# Patient Record
Sex: Female | Born: 1990 | Race: White | Hispanic: No | Marital: Single | Smoking: Never smoker
Health system: Southern US, Community
[De-identification: ages and names within clinical notes are randomized; demographics above are authoritative.]

## PROBLEM LIST (undated history)

## (undated) DIAGNOSIS — G43909 Migraine, unspecified, not intractable, without status migrainosus: Secondary | ICD-10-CM

## (undated) DIAGNOSIS — J45909 Unspecified asthma, uncomplicated: Secondary | ICD-10-CM

## (undated) DIAGNOSIS — T7840XA Allergy, unspecified, initial encounter: Secondary | ICD-10-CM

## (undated) HISTORY — DX: Allergy, unspecified, initial encounter: T78.40XA

## (undated) HISTORY — DX: Migraine, unspecified, not intractable, without status migrainosus: G43.909

## (undated) HISTORY — PX: LAPAROSCOPY: SHX197

## (undated) HISTORY — PX: INNER EAR SURGERY: SHX679

## (undated) HISTORY — PX: MOUTH SURGERY: SHX715

## (undated) HISTORY — PX: KNEE SURGERY: SHX244

## (undated) HISTORY — DX: Unspecified asthma, uncomplicated: J45.909

---
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
## 2008-05-14 ENCOUNTER — Encounter: Admission: RE | Admit: 2008-05-14 | Discharge: 2008-05-14 | Payer: Self-pay | Admitting: Otolaryngology

## 2009-05-08 ENCOUNTER — Encounter: Admission: RE | Admit: 2009-05-08 | Discharge: 2009-05-08 | Payer: Self-pay | Admitting: Orthopedic Surgery

## 2011-02-19 NOTE — Discharge Summary (Signed)
NAME:  Amanda Bates, Amanda Bates NO.:  [id number]   MEDICAL RECORD NO.:  [id number]          PATIENT TYPE:  INP   LOCATION:  6125                         FACILITY:  [facility]   PHYSICIAN:  Dyann Ruddle, MDDATE OF BIRTH:  Oct 29, 1990   DATE OF ADMISSION:  10/05/2006  DATE OF DISCHARGE:                               DISCHARGE SUMMARY   REASON FOR HOSPITALIZATION:  Severe pharyngitis.   SIGNIFICANT FINDINGS:  The patient was admitted for severe pharyngitis  which started on October 03, 2005, making it difficult for her to eat  despite treatment with oxycodone.  At that time, she was negative for  strep by rapid test, flu, and also had a negative Monospot at that time.  On this admission, a CT of the neck was done.  It showed tonsillar  hypertrophy but no abscess or airway impingement.  CBC showed a white  count of 21.2, hemoglobin 12.4, and platelets 187.  Her Monospot was  again negative.  Her blood culture was drawn and is pending.   TREATMENT:  For pain control, the patient received IV morphine as needed  and also Toradol every 6 hours.  She was started on Solu-Medrol every 12  hours as well as Unasyn.  She was able to take improved p.o. within 12  hours of her admission.  She was on maintenance IV fluids while in the  hospital.   OPERATIONS AND PROCEDURES:  None.   FINAL DIAGNOSES:  Pharyngitis, unknown etiology.   DISCHARGE MEDICATIONS:  1. Augmentin ES (600 mg/5 mL).  Take 7 mL p.o. b.i.d. for 9 more days.  2. Hydrocodone liquid as previously prescribed:  Take 2 to 3 teaspoons      every 4 to 6 hours as needed for pain.  3. Motrin 400 mg p.o. every 4 hours p.r.n. pain or fever.  4. Dexamethasone liquid 13 mg p.o. daily for 4 more days.   PENDING RESULTS:  EBV serology.   Follow up with Dr. Hosie Poisson either tomorrow, October 07, 2006, if no  better, or if improving on October 10, 2006.   DISCHARGE WEIGHT:  45.3.   DISCHARGE CONDITION:  Fair.     ______________________________  Pediatrics Resident    ______________________________  Dyann Ruddle, MD   PR/MEDQ  D:  10/06/2006  T:  10/06/2006  Job:  [phone]

## 2012-02-19 ENCOUNTER — Encounter (HOSPITAL_COMMUNITY): Payer: Self-pay | Admitting: Family Medicine

## 2012-02-19 ENCOUNTER — Emergency Department (HOSPITAL_COMMUNITY)
Admission: EM | Admit: 2012-02-19 | Discharge: 2012-02-19 | Disposition: A | Discharge status: Home / Self Care | Payer: BC Managed Care – PPO | Attending: Emergency Medicine | Admitting: Emergency Medicine

## 2012-02-19 DIAGNOSIS — R000 Tachycardia, unspecified: Secondary | ICD-10-CM | POA: Insufficient documentation

## 2012-02-19 DIAGNOSIS — R780 Finding of alcohol in blood: Secondary | ICD-10-CM | POA: Insufficient documentation

## 2012-02-19 DIAGNOSIS — F1010 Alcohol abuse, uncomplicated: Secondary | ICD-10-CM | POA: Insufficient documentation

## 2012-02-19 LAB — DIFFERENTIAL
Eosinophils Absolute: 0.1 10*3/uL (ref 0.0–0.7)
Eosinophils Relative: 1 % (ref 0–5)
Lymphs Abs: 2.3 10*3/uL (ref 0.7–4.0)
Monocytes Absolute: 0.3 10*3/uL (ref 0.1–1.0)

## 2012-02-19 LAB — COMPREHENSIVE METABOLIC PANEL
ALT: 15 U/L (ref 0–35)
CO2: 21 mEq/L (ref 19–32)
Calcium: 8.8 mg/dL (ref 8.4–10.5)
Creatinine, Ser: 0.53 mg/dL (ref 0.50–1.10)
GFR calc Af Amer: >90 mL/min (ref >90)
GFR calc non Af Amer: >90 mL/min (ref >90)
Glucose, Bld: 100 mg/dL — ABNORMAL HIGH (ref 70–99)
Sodium: 142 mEq/L (ref 135–145)
Total Protein: 7.6 g/dL (ref 6.0–8.3)

## 2012-02-19 LAB — CBC
HCT: 39 % (ref 36.0–46.0)
MCH: 31.4 pg (ref 26.0–34.0)
MCV: 92.2 fL (ref 78.0–100.0)
Platelets: 269 10*3/uL (ref 150–400)
RBC: 4.23 MIL/uL (ref 3.87–5.11)
RDW: 11.9 % (ref 11.5–15.5)

## 2012-02-19 MED ORDER — ONDANSETRON HCL 4 MG/2ML IJ SOLN
4.0000 mg | Freq: Once | INTRAMUSCULAR | Status: AC
  Administered 2012-02-19: 4 mg via INTRAVENOUS

## 2012-02-19 MED ORDER — SODIUM CHLORIDE 0.9 % IV BOLUS (SEPSIS)
1000.0000 mL | Freq: Once | INTRAVENOUS | Status: AC
  Administered 2012-02-19: 1000 mL via INTRAVENOUS

## 2012-02-19 MED ORDER — ONDANSETRON HCL 4 MG/2ML IJ SOLN
INTRAMUSCULAR | Status: AC
  Filled 2012-02-19: qty 2

## 2012-02-19 NOTE — Discharge Instructions (Signed)
Alcohol Intoxication Alcohol intoxication means your blood alcohol level is above legal limits. Alcohol is a drug. It has serious side effects. These side effects can include:  Damage to your organs (liver, nervous system, and blood system).   Unclear thinking.   Slowed reflexes.   Decreased muscle coordination.  HOME CARE  Do not drink and drive.   Do not drink alcohol if you are taking medicine or using other drugs. Doing so can cause serious medical problems or even death.   Drink enough water and fluids to keep your pee (urine) clear or pale yellow.   Eat healthy foods.   Only take medicine as told by your doctor.   Join an alcohol support group.  GET HELP RIGHT AWAY IF:  You become shaky when you stop drinking.   Your thinking is unclear or you become confused.   You throw up (vomit) blood. It may look bright red or like coffee grounds.   You notice blood in your poop (bowel movements).   You become lightheaded or pass out (faint).  MAKE SURE YOU:   Understand these instructions.   Will watch your condition.   Will get help right away if you are not doing well or get worse.  Document Released: 03/08/2008 Document Revised: 09/09/2011 Document Reviewed: 03/09/2010 [employer] Patient Information 2012 [employer]. 

## 2012-02-19 NOTE — ED Notes (Signed)
Driver's Lisence and credit cards given to pt's mother.

## 2012-02-19 NOTE — ED Notes (Signed)
Patient states she remembers drinking a bottle of wine and taking "a couple of shots."

## 2012-02-19 NOTE — ED Provider Notes (Signed)
History     CSN: [id number]  Arrival date & time 02/19/12  0306   First MD Initiated Contact with Patient 02/19/12 [phone]      Chief Complaint  Patient presents with  . Alcohol Intoxication    (Consider location/radiation/quality/duration/timing/severity/associated sxs/prior treatment) HPI Comments: Found by her friends slumped over a stir rail after drinking a large amount of ETOH  he did normally does not drink as much.  She was out with friends, now celebrating, but disorder grieving the end of her relationship, parents at the bedside.  He denies any injury, stating she feels better at this time  The history is provided by the patient.    History reviewed. No pertinent past medical history.  Past Surgical History  Procedure Date  . Mouth surgery   . Inner ear surgery   . Knee surgery     No family history on file.  History  Substance Use Topics  . Smoking status: Never Smoker   . Smokeless tobacco: Not on file  . Alcohol Use: Yes     Occasional    OB History    Grav Para Term Preterm Abortions TAB SAB Ect Mult Living                  Review of Systems  Constitutional: Positive for activity change.  Gastrointestinal: Positive for nausea and vomiting.  Psychiatric/Behavioral: Negative for self-injury.    Allergies  Review of patient's allergies indicates no known allergies.  Home Medications   Current Outpatient Rx  Name Route Sig Dispense Refill  . AZITHROMYCIN 500 MG PO TABS Oral Take 1,000 mg by mouth once.    Marland Kitchen LOESTRIN 24 FE PO Oral Take 1 tablet by mouth daily.      BP 111/69  Pulse 120  Temp(Src) 98.5 F (36.9 C) (Oral)  Resp 20  SpO2 98%  LMP 11/24/2011  Physical Exam  Constitutional: She is oriented to person, place, and time. She appears well-developed and well-nourished.  HENT:  Head: Normocephalic.  Eyes: Pupils are equal, round, and reactive to light.  Neck: Normal range of motion.  Cardiovascular: Tachycardia present.     Pulmonary/Chest: Effort normal.  Abdominal: Soft. She exhibits no distension.  Musculoskeletal: Normal range of motion.  Neurological: She is alert and oriented to person, place, and time.  Skin: Skin is warm and dry.  Psychiatric: She expresses impulsivity.    ED Course  Procedures (including critical care time)  Labs Reviewed  ETHANOL - Abnormal; Notable for the following:    Alcohol, Ethyl (B) 171 (*)    All other components within normal limits  COMPREHENSIVE METABOLIC PANEL - Abnormal; Notable for the following:    Potassium 3.4 (*)    Glucose, Bld 100 (*)    Total Bilirubin 0.2 (*)    All other components within normal limits  CBC  DIFFERENTIAL   No results found.   No diagnosis found.    MDM          Arman Filter, NP 02/19/12 [phone]

## 2012-02-19 NOTE — ED Notes (Signed)
Per EMS, patient was found slumped over steps by a bar. Has had unknown amount of wine, beer and liquor.

## 2012-02-21 NOTE — ED Provider Notes (Signed)
Medical screening examination/treatment/procedure(s) were performed by non-physician practitioner and as supervising physician I was immediately available for consultation/collaboration.  Eshal Propps, MD 02/21/12 0405 

## 2012-04-17 ENCOUNTER — Ambulatory Visit: Payer: BC Managed Care – PPO | Admitting: Family Medicine

## 2012-04-17 VITALS — BP 125/76 | HR 76 | Temp 98.7°F | Resp 16 | Ht 63.0 in | Wt 98.0 lb

## 2012-04-17 DIAGNOSIS — L299 Pruritus, unspecified: Secondary | ICD-10-CM

## 2012-04-17 DIAGNOSIS — R21 Rash and other nonspecific skin eruption: Secondary | ICD-10-CM

## 2012-04-17 MED ORDER — TRIAMCINOLONE ACETONIDE 0.1 % EX CREA: TOPICAL_CREAM | Freq: Two times a day (BID) | CUTANEOUS | Status: AC

## 2012-04-17 MED ORDER — DOXYCYCLINE HYCLATE 100 MG PO TABS: 100.0000 mg | ORAL_TABLET | Freq: Two times a day (BID) | ORAL | Status: AC

## 2012-04-17 NOTE — Progress Notes (Signed)
[facility]:  Office Visit  Chief Complaint:  Chief Complaint  Patient presents with  . Insect Bite    on legs    HPI: Amanda Bates is a 21 y.o. female who complains of  1 week h/o bilateral leg rash. Running. [place]. Mom has it all over her chest. Starts out as blister like then turns into purple rash. Described as burning. URI sxs, more muscle aches. But assumed sxs were from running. No new meds, foods, travels except to beach.  History reviewed. No pertinent past medical history. Past Surgical History  Procedure Date  . Mouth surgery   . Inner ear surgery   . Knee surgery    History   Social History  . Marital Status: Single    Spouse Name: N/A    Number of Children: N/A  . Years of Education: N/A   Social History Main Topics  . Smoking status: Never Smoker   . Smokeless tobacco: None  . Alcohol Use: Yes     Occasional  . Drug Use: Yes    Special: Marijuana  . Sexually Active: Yes   Other Topics Concern  . None   Social History Narrative  . None   No family history on file. No Known Allergies Prior to Admission medications   Medication Sig Start Date End Date Taking? Authorizing Provider  Norethin Ace-Eth Estrad-FE (LOESTRIN 24 FE PO) Take 1 tablet by mouth daily.   Yes Historical Provider, MD  azithromycin (ZITHROMAX) 500 MG tablet Take 1,000 mg by mouth once.    Historical Provider, MD  doxycycline (VIBRA-TABS) 100 MG tablet Take 1 tablet (100 mg total) by mouth 2 (two) times daily. 04/17/12 04/27/12  Irais Mottram P Beulah Matusek, DO  triamcinolone cream (KENALOG) 0.1 % Apply topically 2 (two) times daily. 04/17/12 04/17/13  Ashon Rosenberg P Marialy Urbanczyk, DO     ROS: The patient denies fevers, chills, night sweats, unintentional weight loss, chest pain, palpitations, wheezing, dyspnea on exertion, nausea, vomiting, abdominal pain, dysuria, hematuria, melena, numbness, weakness, or tingling.   All other systems have been reviewed and were otherwise negative with the exception of  those mentioned in the HPI and as above.    PHYSICAL EXAM: Filed Vitals:   04/17/12 1737  BP: 125/76  Pulse: 76  Temp: 98.7 F (37.1 C)  Resp: 16   Filed Vitals:   04/17/12 1737  Height: 5\' 3"  (1.6 m)  Weight: 98 lb (44.453 kg)   Body mass index is 17.36 kg/(m^2).  General: Alert, no acute distress HEENT:  Normocephalic, atraumatic, oropharynx patent.  Cardiovascular:  Regular rate and rhythm, no rubs murmurs or gallops.  No Carotid bruits, radial pulse intact. No pedal edema.  Respiratory: Clear to auscultation bilaterally.  No wheezes, rales, or rhonchi.  No cyanosis, no use of accessory musculature GI: No organomegaly, abdomen is soft and non-tender, positive bowel sounds.  No masses. Skin: + rash, nonblanching purple circular lesions macular about 1/4 inch in diameter and length on legs x 2 on right and 1 on left leg, no petechiae Neurologic: Facial musculature symmetric. Psychiatric: Patient is appropriate throughout our interaction. Lymphatic: No cervical lymphadenopathy Musculoskeletal: Gait intact.   LABS: Results for orders placed during the hospital encounter of 02/19/12  ETHANOL      Component Value Range   Alcohol, Ethyl (B) 171 (*) 0 - 11 mg/dL  CBC      Component Value Range   WBC 5.2  4.0 - 10.5 K/uL   RBC 4.23  3.87 - 5.11 MIL/uL   Hemoglobin 13.3  12.0 - 15.0 g/dL   HCT 16.1  09.6 - 04.5 %   MCV 92.2  78.0 - 100.0 fL   MCH 31.4  26.0 - 34.0 pg   MCHC 34.1  30.0 - 36.0 g/dL   RDW 40.9  81.1 - 91.4 %   Platelets 269  150 - 400 K/uL  DIFFERENTIAL      Component Value Range   Neutrophils Relative 48  43 - 77 %   Neutro Abs 2.5  1.7 - 7.7 K/uL   Lymphocytes Relative 45  12 - 46 %   Lymphs Abs 2.3  0.7 - 4.0 K/uL   Monocytes Relative 6  3 - 12 %   Monocytes Absolute 0.3  0.1 - 1.0 K/uL   Eosinophils Relative 1  0 - 5 %   Eosinophils Absolute 0.1  0.0 - 0.7 K/uL   Basophils Relative 1  0 - 1 %   Basophils Absolute 0.0  0.0 - 0.1 K/uL  COMPREHENSIVE  METABOLIC PANEL      Component Value Range   Sodium 142  135 - 145 mEq/L   Potassium 3.4 (*) 3.5 - 5.1 mEq/L   Chloride 106  96 - 112 mEq/L   CO2 21  19 - 32 mEq/L   Glucose, Bld 100 (*) 70 - 99 mg/dL   BUN 7  6 - 23 mg/dL   Creatinine, Ser 7.82  0.50 - 1.10 mg/dL   Calcium 8.8  8.4 - 95.6 mg/dL   Total Protein 7.6  6.0 - 8.3 g/dL   Albumin 4.1  3.5 - 5.2 g/dL   AST 22  0 - 37 U/L   ALT 15  0 - 35 U/L   Alkaline Phosphatase 51  39 - 117 U/L   Total Bilirubin 0.2 (*) 0.3 - 1.2 mg/dL   GFR calc non Af Amer >90  >90 mL/min   GFR calc Af Amer >90  >90 mL/min     EKG/XRAY:   Primary read interpreted by Dr. Conley Rolls at [facility].   ASSESSMENT/PLAN: Encounter Diagnoses  Name Primary?  . Rash Yes  . Itching     Will presumptively treat for tick borne disease due to tick exposure from running on grassy trail and new onset rash.  Rx Doxycycline and also triamcinolone cream prn itch/burn.    Hamilton Capri PHUONG, DO 04/17/2012 6:44 PM

## 2012-05-02 ENCOUNTER — Ambulatory Visit
Admission: RE | Admit: 2012-05-02 | Discharge: 2012-05-02 | Disposition: A | Discharge status: Home / Self Care | Payer: BC Managed Care – PPO | Source: Ambulatory Visit | Attending: Family Medicine | Admitting: Family Medicine

## 2012-05-02 ENCOUNTER — Other Ambulatory Visit: Payer: Self-pay | Admitting: Family Medicine

## 2012-05-02 DIAGNOSIS — M545 Low back pain, unspecified: Secondary | ICD-10-CM

## 2012-09-24 ENCOUNTER — Ambulatory Visit: Payer: BC Managed Care – PPO | Admitting: Family Medicine

## 2012-09-24 ENCOUNTER — Ambulatory Visit: Payer: BC Managed Care – PPO

## 2012-09-24 VITALS — BP 112/69 | HR 87 | Temp 98.3°F | Resp 18 | Ht 62.0 in | Wt 100.0 lb

## 2012-09-24 DIAGNOSIS — M79604 Pain in right leg: Secondary | ICD-10-CM

## 2012-09-24 DIAGNOSIS — M79609 Pain in unspecified limb: Secondary | ICD-10-CM

## 2012-09-24 MED ORDER — MELOXICAM 7.5 MG PO TABS: 7.5000 mg | ORAL_TABLET | Freq: Every day | ORAL | Status: DC

## 2012-09-24 NOTE — Progress Notes (Signed)
21 yo runner who [place] in [place]. Who was diagnosed with right tibial stress fracture 4 weeks ago.  SHe had a cam walker for 3 weeks and then a week ago was told to stop the cam walker and not resume for another 3 weeks. Ibuprofen is not helping.  Pain is aching in quality  Objective:  NAD Mild ecchymotic discoloration with some swelling in proximal third of the tibia.  Nontender tibia.  No bony abnormality.  [facility] reading (PRIMARY) by  Dr. Milus Glazier: tib/fib.  No abnormality   Assessment: This patient is recovering from a stress fracture. The new location of pain along with swelling suggestion she may have shin splints.  Plan:  Ice the tibia, start Mobic 7.5 mg daily  Recheck 1 week

## 2012-09-24 NOTE — Patient Instructions (Addendum)
Plan:  Ice the tibia three times a day for 20 minutes, start Mobic 7.5 mg daily  Recheck 1 week     Shin Splints Shin splints is a painful condition that is felt on the shinbone or in the muscles on either side of the bone (front of your lower leg). Shin splints happen when physical activities, such as sports or other demanding exercise, leads to inflammation of the muscles, tendons, and the thin layer that covers the shinbone.  CAUSES   Overuse of muscles.  Repetitive activities.  Flat feet or rigid arches. Activities that could contribute to shin splints include:  A sudden increase in exercise time.  Starting a new, demanding activity.  Running up hills or long distances.  Playing sports with sudden starts and stops.  A poor warm up.  Old or worn-out shoes. SYMPTOMS   Pain on the front of the leg.  Pain while exercising or at rest. DIAGNOSIS  Your caregiver will diagnose shin splints from a history of your symptoms and a physical exam. You may be observed as you walk or run. X-ray exams or further testing may be needed to rule out other problems, such as a stress fracture, which also causes lower leg pain. TREATMENT  Your caregiver may decide on the treatment based on your age, history, health, and how bad the pain is. Most cases of shin splints can be managed by one or more of the following:  Resting.  Reducing the length and intensity of your exercise.  Stopping the activity that causes shin pain.  Taking medicines to control the inflammation.  Icing, massaging, stretching, and strengthening the affected area.  Getting shoes with rigid heels, shock absorption, and a good arch support. HOME CARE INSTRUCTIONS   Resume activity steadily or as directed by your caregiver.  Restart your exercise sessions with non-weight-bearing exercises, such as cycling or swimming.  Stop running if the pain returns.  Warm up properly before exercising.  Run on a level and  fairly firm surface.  Gradually change the intensity of an exercise.  Limit increases in running distance by no more than 5 to 10% weekly. This means if you are running 5 miles, you can only increase your run by 1/2 a mile at a time.  Change your athletic shoes every 6 months, or every 350 to 450 miles. SEEK MEDICAL CARE IF:   Symptoms continue or worsen even after treatment.  The location, intensity, or type of pain changes over time. SEEK IMMEDIATE MEDICAL CARE IF:   You have severe pain.  You have trouble walking. MAKE SURE YOU:  Understand these instructions.  Will watch your condition.  Will get help right away if you are not doing well or get worse. Document Released: 09/17/2000 Document Revised: 12/13/2011 Document Reviewed: 03/07/2011 [facility] Patient Information 2013 [place], [place].

## 2013-09-02 ENCOUNTER — Ambulatory Visit: Payer: BC Managed Care – PPO | Admitting: Internal Medicine

## 2013-09-02 VITALS — BP 113/77 | HR 99 | Temp 98.1°F | Resp 16 | Ht 63.0 in | Wt 95.2 lb

## 2013-09-02 DIAGNOSIS — K529 Noninfective gastroenteritis and colitis, unspecified: Secondary | ICD-10-CM

## 2013-09-02 DIAGNOSIS — R1013 Epigastric pain: Secondary | ICD-10-CM

## 2013-09-02 DIAGNOSIS — K5289 Other specified noninfective gastroenteritis and colitis: Secondary | ICD-10-CM

## 2013-09-02 DIAGNOSIS — E860 Dehydration: Secondary | ICD-10-CM

## 2013-09-02 DIAGNOSIS — N390 Urinary tract infection, site not specified: Secondary | ICD-10-CM

## 2013-09-02 DIAGNOSIS — R42 Dizziness and giddiness: Secondary | ICD-10-CM

## 2013-09-02 DIAGNOSIS — R8271 Bacteriuria: Secondary | ICD-10-CM

## 2013-09-02 LAB — POCT CBC
Granulocyte percent: 51.9 %G (ref 37–80)
HCT, POC: 39.7 % (ref 37.7–47.9)
Hemoglobin: 12.2 g/dL (ref 12.2–16.2)
Lymph, poc: 1.3 (ref 0.6–3.4)
MCH, POC: 30.9 pg (ref 27–31.2)
MCHC: 30.7 g/dL — AB (ref 31.8–35.4)
MCV: 100.4 fL — AB (ref 80–97)
MID (cbc): 0.3 (ref 0–0.9)
MPV: 9.6 fL (ref 0–99.8)
POC Granulocyte: 1.7 — AB (ref 2–6.9)
POC LYMPH PERCENT: 40.2 %L (ref 10–50)
POC MID %: 7.9 %M (ref 0–12)
Platelet Count, POC: 147 10*3/uL (ref 142–424)
RBC: 3.95 M/uL — AB (ref 4.04–5.48)
RDW, POC: 13 %
WBC: 3.2 10*3/uL — AB (ref 4.6–10.2)

## 2013-09-02 LAB — COMPREHENSIVE METABOLIC PANEL
ALT: 18 U/L (ref 0–35)
AST: 19 U/L (ref 0–37)
Albumin: 4.2 g/dL (ref 3.5–5.2)
Alkaline Phosphatase: 44 U/L (ref 39–117)
BUN: 12 mg/dL (ref 6–23)
CO2: 27 mEq/L (ref 19–32)
Calcium: 9.1 mg/dL (ref 8.4–10.5)
Chloride: 104 mEq/L (ref 96–112)
Creat: 0.72 mg/dL (ref 0.50–1.10)
Glucose, Bld: 85 mg/dL (ref 70–99)
Potassium: 4.3 mEq/L (ref 3.5–5.3)
Sodium: 140 mEq/L (ref 135–145)
Total Bilirubin: 0.5 mg/dL (ref 0.3–1.2)
Total Protein: 6.7 g/dL (ref 6.0–8.3)

## 2013-09-02 LAB — POCT URINALYSIS DIPSTICK
Blood, UA: NEGATIVE
Glucose, UA: NEGATIVE
Ketones, UA: 15
Nitrite, UA: NEGATIVE
Spec Grav, UA: 1.025
Urobilinogen, UA: 1
pH, UA: 5.5

## 2013-09-02 LAB — IFOBT (OCCULT BLOOD): IFOBT: NEGATIVE

## 2013-09-02 LAB — POCT UA - MICROSCOPIC ONLY
Casts, Ur, LPF, POC: NEGATIVE
Crystals, Ur, HPF, POC: NEGATIVE
Yeast, UA: NEGATIVE

## 2013-09-02 LAB — LIPASE: Lipase: <10 U/L (ref 0–75)

## 2013-09-02 MED ORDER — ONDANSETRON 4 MG PO TBDP
8.0000 mg | ORAL_TABLET | Freq: Once | ORAL | Status: AC
  Administered 2013-09-02: 8 mg via ORAL

## 2013-09-02 MED ORDER — AMOXICILLIN 500 MG PO CAPS: 1000.0000 mg | ORAL_CAPSULE | Freq: Two times a day (BID) | ORAL | Status: DC

## 2013-09-02 MED ORDER — ONDANSETRON HCL 8 MG PO TABS: 8.0000 mg | ORAL_TABLET | Freq: Three times a day (TID) | ORAL | Status: DC | PRN

## 2013-09-02 NOTE — Patient Instructions (Signed)
Diet for Diarrhea, Adult Frequent, runny stools (diarrhea) may be caused or worsened by food or drink. Diarrhea may be relieved by changing your diet. Since diarrhea can last up to 7 days, it is easy for you to lose too much fluid from the body and become dehydrated. Fluids that are lost need to be replaced. Along with a modified diet, make sure you drink enough fluids to keep your urine clear or pale yellow. DIET INSTRUCTIONS  Ensure adequate fluid intake (hydration): have 1 cup (8 oz) of fluid for each diarrhea episode. Avoid fluids that contain simple sugars or sports drinks, fruit juices, whole milk products, and sodas. Your urine should be clear or pale yellow if you are drinking enough fluids. Hydrate with an oral rehydration solution that you can purchase at pharmacies, retail stores, and online. You can prepare an oral rehydration solution at home by mixing the following ingredients together:    tsp table salt.   tsp baking soda.   tsp salt substitute containing potassium chloride.  1  tablespoons sugar.  1 L (34 oz) of water.  Certain foods and beverages may increase the speed at which food moves through the gastrointestinal (GI) tract. These foods and beverages should be avoided and include:  Caffeinated and alcoholic beverages.  High-fiber foods, such as raw fruits and vegetables, nuts, seeds, and whole grain breads and cereals.  Foods and beverages sweetened with sugar alcohols, such as xylitol, sorbitol, and mannitol.  Some foods may be well tolerated and may help thicken stool including:  Starchy foods, such as rice, toast, pasta, low-sugar cereal, oatmeal, grits, baked potatoes, crackers, and bagels.   Bananas.   Applesauce.  Add probiotic-rich foods to help increase healthy bacteria in the GI tract, such as yogurt and fermented milk products. RECOMMENDED FOODS AND BEVERAGES Starches Choose foods with less than 2 g of fiber per serving.  Recommended:  White,  [place], and pita breads, plain rolls, buns, bagels. Plain muffins, matzo. Soda, saltine, or graham crackers. Pretzels, melba toast, zwieback. Cooked cereals made with water: cornmeal, farina, cream cereals. Dry cereals: refined corn, wheat, rice. Potatoes prepared any way without skins, refined macaroni, spaghetti, noodles, refined rice.  Avoid:  Bread, rolls, or crackers made with whole wheat, multi-grains, rye, bran seeds, nuts, or coconut. Corn tortillas or taco shells. Cereals containing whole grains, multi-grains, bran, coconut, nuts, raisins. Cooked or dry oatmeal. Coarse wheat cereals, granola. Cereals advertised as "high-fiber." Potato skins. Whole grain pasta, wild or brown rice. Popcorn. Sweet potatoes, yams. Sweet rolls, doughnuts, waffles, pancakes, sweet breads. Vegetables  Recommended: Strained tomato and vegetable juices. Most well-cooked and canned vegetables without seeds. Fresh: Tender lettuce, cucumber without the skin, cabbage, spinach, bean sprouts.  Avoid: Fresh, cooked, or canned: Artichokes, baked beans, beet greens, broccoli, Brussels sprouts, corn, kale, legumes, peas, sweet potatoes. Cooked: Green or red cabbage, spinach. Avoid large servings of any vegetables because vegetables shrink when cooked, and they contain more fiber per serving than fresh vegetables. Fruit  Recommended: Cooked or canned: Apricots, applesauce, cantaloupe, cherries, fruit cocktail, grapefruit, grapes, kiwi, mandarin oranges, peaches, pears, plums, watermelon. Fresh: Apples without skin, ripe banana, grapes, cantaloupe, cherries, grapefruit, peaches, oranges, plums. Keep servings limited to  cup or 1 piece.  Avoid: Fresh: Apples with skin, apricots, mangoes, pears, raspberries, strawberries. Prune juice, stewed or dried prunes. Dried fruits, raisins, dates. Large servings of all fresh fruits. Protein  Recommended: Ground or well-cooked tender beef, ham, veal, lamb, pork, or poultry. Eggs. Fish,  oysters, shrimp,  lobster, other seafoods. Liver, organ meats.  Avoid: Tough, fibrous meats with gristle. Peanut butter, smooth or chunky. Cheese, nuts, seeds, legumes, dried peas, beans, lentils. Dairy  Recommended: Yogurt, lactose-free milk, kefir, drinkable yogurt, buttermilk, soy milk, or plain hard cheese.  Avoid: Milk, chocolate milk, beverages made with milk, such as milkshakes. Soups  Recommended: Bouillon, broth, or soups made from allowed foods. Any strained soup.  Avoid: Soups made from vegetables that are not allowed, cream or milk-based soups. Desserts and Sweets  Recommended: Sugar-free gelatin, sugar-free frozen ice pops made without sugar alcohol.  Avoid: Plain cakes and cookies, pie made with fruit, pudding, custard, cream pie. Gelatin, fruit, ice, sherbet, frozen ice pops. Ice cream, ice milk without nuts. Plain hard candy, honey, jelly, molasses, syrup, sugar, chocolate syrup, gumdrops, marshmallows. Fats and Oils  Recommended: Limit fats to less than 8 tsp per day.  Avoid: Seeds, nuts, olives, avocados. Margarine, butter, cream, mayonnaise, salad oils, plain salad dressings. Plain gravy, crisp bacon without rind. Beverages  Recommended: Water, decaffeinated teas, oral rehydration solutions, sugar-free beverages not sweetened with sugar alcohols.  Avoid: Fruit juices, caffeinated beverages (coffee, tea, soda), alcohol, sports drinks, or lemon-lime soda. Condiments  Recommended: Ketchup, mustard, horseradish, vinegar, cocoa powder. Spices in moderation: allspice, basil, bay leaves, celery powder or leaves, cinnamon, cumin powder, curry powder, ginger, mace, marjoram, onion or garlic powder, oregano, paprika, parsley flakes, ground pepper, rosemary, sage, savory, tarragon, thyme, turmeric.  Avoid: Coconut, honey. Document Released: 12/11/2003 Document Revised: 06/14/2012 Document Reviewed: 02/04/2012 [facility] Patient Information 2014 [place], [place]. Norovirus  Infection Norovirus illness is caused by a viral infection. The term norovirus refers to a group of viruses. Any of those viruses can cause norovirus illness. This illness is often referred to by other names such as viral gastroenteritis, stomach flu, and food poisoning. Anyone can get a norovirus infection. People can have the illness multiple times during their lifetime. CAUSES  Norovirus is found in the stool or vomit of infected people. It is easily spread from person to person (contagious). People with norovirus are contagious from the moment they begin feeling ill. They may remain contagious for as long as 3 days to 2 weeks after recovery. People can become infected with the virus in several ways. This includes:  Eating food or drinking liquids that are contaminated with norovirus.  Touching surfaces or objects contaminated with norovirus, and then placing your hand in your mouth.  Having direct contact with a person who is infected and shows symptoms. This may occur while caring for someone with illness or while sharing foods or eating utensils with someone who is ill. SYMPTOMS  Symptoms usually begin 1 to 2 days after ingestion of the virus. Symptoms may include:  Nausea.  Vomiting.  Diarrhea.  Stomach cramps.  Low-grade fever.  Chills.  Headache.  Muscle aches.  Tiredness. Most people with norovirus illness get better within 1 to 2 days. Some people become dehydrated because they cannot drink enough liquids to replace those lost from vomiting and diarrhea. This is especially true for young children, the elderly, and others who are unable to care for themselves. DIAGNOSIS  Diagnosis is based on your symptoms and exam. Currently, only state public health laboratories have the ability to test for norovirus in stool or vomit. TREATMENT  No specific treatment exists for norovirus infections. No vaccine is available to prevent infections. Norovirus illness is usually brief in  healthy people. If you are ill with vomiting and diarrhea, you should drink enough water  and fluids to keep your urine clear or pale yellow. Dehydration is the most serious health effect that can result from this infection. By drinking oral rehydration solution (ORS), people can reduce their chance of becoming dehydrated. There are many commercially available pre-made and powdered ORS designed to safely rehydrate people. These may be recommended by your caregiver. Replace any new fluid losses from diarrhea or vomiting with ORS as follows:  If your child weighs 10 kg or less (22 lb or less), give 60 to 120 ml ( to  cup or 2 to 4 oz) of ORS for each diarrheal stool or vomiting episode.  If your child weighs more than 10 kg (more than 22 lb), give 120 to 240 ml ( to 1 cup or 4 to 8 oz) of ORS for each diarrheal stool or vomiting episode. HOME CARE INSTRUCTIONS   Follow all your caregiver's instructions.  Avoid sugar-free and alcoholic drinks while ill.  Only take over-the-counter or prescription medicines for pain, vomiting, diarrhea, or fever as directed by your caregiver. You can decrease your chances of coming in contact with norovirus or spreading it by following these steps:  Frequently wash your hands, especially after using the toilet, changing diapers, and before eating or preparing food.  Carefully wash fruits and vegetables. Cook shellfish before eating them.  Do not prepare food for others while you are infected and for at least 3 days after recovering from illness.  Thoroughly clean and disinfect contaminated surfaces immediately after an episode of illness using a bleach-based household cleaner.  Immediately remove and wash clothing or linens that may be contaminated with the virus.  Use the toilet to dispose of any vomit or stool. Make sure the surrounding area is kept clean.  Food that may have been contaminated by an ill person should be discarded. SEEK IMMEDIATE MEDICAL  CARE IF:   You develop symptoms of dehydration that do not improve with fluid replacement. This may include:  Excessive sleepiness.  Lack of tears.  Dry mouth.  Dizziness when standing.  Weak pulse. Document Released: 12/11/2002 Document Revised: 12/13/2011 Document Reviewed: 01/12/2010 [facility] Patient Information 2014 [place], [place].

## 2013-09-02 NOTE — Progress Notes (Signed)
   Subjective:    Patient ID: Amanda Bates, female    DOB: [birth year], 22 y.o.   MRN: [id number]  HPI    Review of Systems     Objective:   Physical Exam        Assessment & Plan:

## 2013-09-02 NOTE — Progress Notes (Signed)
Subjective:    Patient ID: Amanda Bates, female    DOB: 1991/02/08, 22 y.o.   MRN: [id number]  HPI Sxs started 3 nights with abd pain, vomiting and diarrhea. Vomiting lasted about 12 hrs and she hasn't vomited since. Diarrhea continued until last night. Tried Kaopectate last night with success, helped with diarrhea. Had 100-101 fever Fri and Sat. Now having abd cramping and black stools starting this morning. Has been unable to eat anything but soup. Has been keeping pedialyte down for the last 2 days with a little nausea, but no vomiting. Has lost 7 lbs since Thurs night. No h/o of abd surgeries, ulcers, or abd bleeding. Dizzy when rises, weak, nausea with fluid intake. Actually better than she was.  Healthy otherwise. 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    Review of Systems [employer]    Objective:   Physical Exam  Vitals reviewed. Constitutional: She is oriented to person, place, and time. She appears well-developed and well-nourished. No distress.  HENT:  Mouth/Throat: Oropharynx is clear and moist.  Cardiovascular: Normal rate, regular rhythm and normal heart sounds.   Pulmonary/Chest: Effort normal.  Abdominal: Soft. She exhibits no distension and no mass. Bowel sounds are decreased. There is no hepatosplenomegaly. There is tenderness in the epigastric area. There is no rigidity, no rebound, no guarding, no CVA tenderness, no tenderness at McBurney's point and negative Murphy's sign.  Musculoskeletal: Normal range of motion.  Neurological: She is alert and oriented to person, place, and time. No cranial nerve deficit. She exhibits normal muscle tone. Coordination normal.  Skin: No rash noted.  Psychiatric: She has a normal mood and affect. Her behavior is normal.   MMs moist  Labs.this  Results for orders placed in visit on 09/02/13  POCT CBC      Result Value Range   WBC 3.2 (*) 4.6 - 10.2 K/uL   Lymph, poc 1.3  0.6 - 3.4   POC LYMPH PERCENT  40.2  10 - 50 %L   MID (cbc) 0.3  0 - 0.9   POC MID % 7.9  0 - 12 %M   POC Granulocyte 1.7 (*) 2 - 6.9   Granulocyte percent 51.9  37 - 80 %G   RBC 3.95 (*) 4.04 - 5.48 M/uL   Hemoglobin 12.2  12.2 - 16.2 g/dL   HCT, POC 40.9  81.1 - 47.9 %   MCV 100.4 (*) 80 - 97 fL   MCH, POC 30.9  27 - 31.2 pg   MCHC 30.7 (*) 31.8 - 35.4 g/dL   RDW, POC 91.4     Platelet Count, POC 147  142 - 424 K/uL   MPV 9.6  0 - 99.8 fL  POCT URINALYSIS DIPSTICK      Result Value Range   Color, UA amber     Clarity, UA hazy     Glucose, UA neg     Bilirubin, UA small     Ketones, UA 15     Spec Grav, UA 1.025     Blood, UA neg     pH, UA 5.5     Protein, UA trace     Urobilinogen, UA 1.0     Nitrite, UA neg     Leukocytes, UA Trace    POCT UA - MICROSCOPIC ONLY      Result Value Range   WBC, Ur, HPF, POC 8-12     RBC, urine, microscopic 0-2  Bacteria, U Microscopic 1+     Mucus, UA 1+     Epithelial cells, urine per micros 3-6     Crystals, Ur, HPF, POC neg     Casts, Ur, LPF, POC neg     Yeast, UA neg    IFOBT (OCCULT BLOOD)      Result Value Range   IFOBT Negative         Assessment & Plan:  Gastroenteritis/Dehydration Zofran 8mg  po dissolvable/Gatorade dilute sips trial

## 2013-09-03 ENCOUNTER — Encounter: Payer: Self-pay | Admitting: Family Medicine

## 2013-09-03 LAB — URINE CULTURE
Colony Count: NO GROWTH
Organism ID, Bacteria: NO GROWTH

## 2013-09-05 ENCOUNTER — Encounter: Payer: Self-pay | Admitting: Family Medicine

## 2015-05-12 ENCOUNTER — Emergency Department (HOSPITAL_COMMUNITY)
Admission: EM | Admit: 2015-05-12 | Discharge: 2015-05-13 | Disposition: A | Discharge status: Home / Self Care | Payer: BLUE CROSS/BLUE SHIELD | Attending: Emergency Medicine | Admitting: Emergency Medicine

## 2015-05-12 DIAGNOSIS — J45909 Unspecified asthma, uncomplicated: Secondary | ICD-10-CM | POA: Insufficient documentation

## 2015-05-12 DIAGNOSIS — R1110 Vomiting, unspecified: Secondary | ICD-10-CM | POA: Insufficient documentation

## 2015-05-12 DIAGNOSIS — T50905A Adverse effect of unspecified drugs, medicaments and biological substances, initial encounter: Secondary | ICD-10-CM

## 2015-05-12 DIAGNOSIS — T368X5A Adverse effect of other systemic antibiotics, initial encounter: Secondary | ICD-10-CM | POA: Diagnosis not present

## 2015-05-12 DIAGNOSIS — R42 Dizziness and giddiness: Secondary | ICD-10-CM | POA: Insufficient documentation

## 2015-05-12 DIAGNOSIS — Z79899 Other long term (current) drug therapy: Secondary | ICD-10-CM | POA: Insufficient documentation

## 2015-05-12 DIAGNOSIS — Z3202 Encounter for pregnancy test, result negative: Secondary | ICD-10-CM | POA: Insufficient documentation

## 2015-05-12 LAB — COMPREHENSIVE METABOLIC PANEL
ALBUMIN: 4.4 g/dL (ref 3.5–5.0)
ALK PHOS: 55 U/L (ref 38–126)
ALT: 15 U/L (ref 14–54)
AST: 17 U/L (ref 15–41)
Anion gap: 8 (ref 5–15)
BILIRUBIN TOTAL: 0.3 mg/dL (ref 0.3–1.2)
BUN: 11 mg/dL (ref 6–20)
CALCIUM: 9.1 mg/dL (ref 8.9–10.3)
CO2: 28 mmol/L (ref 22–32)
CREATININE: 0.8 mg/dL (ref 0.44–1.00)
Chloride: 99 mmol/L — ABNORMAL LOW (ref 101–111)
GFR calc Af Amer: >60 mL/min (ref >60)
Glucose, Bld: 115 mg/dL — ABNORMAL HIGH (ref 65–99)
POTASSIUM: 4 mmol/L (ref 3.5–5.1)
SODIUM: 135 mmol/L (ref 135–145)
TOTAL PROTEIN: 7.5 g/dL (ref 6.5–8.1)

## 2015-05-12 LAB — CBC
HCT: 41.1 % (ref 36.0–46.0)
HEMOGLOBIN: 14 g/dL (ref 12.0–15.0)
MCH: 32.2 pg (ref 26.0–34.0)
MCHC: 34.1 g/dL (ref 30.0–36.0)
MCV: 94.5 fL (ref 78.0–100.0)
Platelets: 219 10*3/uL (ref 150–400)
RBC: 4.35 MIL/uL (ref 3.87–5.11)
RDW: 12.2 % (ref 11.5–15.5)
WBC: 10.7 10*3/uL — AB (ref 4.0–10.5)

## 2015-05-12 LAB — LIPASE, BLOOD: Lipase: 18 U/L — ABNORMAL LOW (ref 22–51)

## 2015-05-12 MED ORDER — DIPHENHYDRAMINE HCL 50 MG/ML IJ SOLN
25.0000 mg | Freq: Once | INTRAMUSCULAR | Status: AC
  Administered 2015-05-12: 25 mg via INTRAVENOUS
  Filled 2015-05-12: qty 1

## 2015-05-12 MED ORDER — ONDANSETRON HCL 4 MG/2ML IJ SOLN
4.0000 mg | Freq: Once | INTRAMUSCULAR | Status: AC
  Administered 2015-05-12: 4 mg via INTRAVENOUS
  Filled 2015-05-12: qty 2

## 2015-05-12 MED ORDER — SODIUM CHLORIDE 0.9 % IV BOLUS (SEPSIS)
1000.0000 mL | Freq: Once | INTRAVENOUS | Status: AC
  Administered 2015-05-12: 1000 mL via INTRAVENOUS

## 2015-05-12 NOTE — ED Notes (Signed)
Patient made aware urine sample needed.  Reports she cannot give one at present.  States "I have been throwing up all day".

## 2015-05-12 NOTE — ED Notes (Signed)
Pt A+Ox4, reports multiple complaints.  Sts has had a sinus infection xweeks, seen by PCP and taking abx.  Pt reports switched to Levaquin x4 days ago and onset last night/this AM of dizziness/vomiting.  Pt reports sinus pressure/cough has not improved since being on abx.  No active vomiting.  Pt denies pain elsewhere.  Speaking full/clear sentences, rr even/un-lab.  MAEI, ambulatory with steady gait.  NAD.

## 2015-05-12 NOTE — ED Notes (Signed)
Pt made aware she is NPO

## 2015-05-12 NOTE — ED Provider Notes (Addendum)
CSN: [id number]     Arrival date & time 05/12/15  1710 History   First MD Initiated Contact with Patient 05/12/15 2254     Chief Complaint  Patient presents with  . Dizziness and Vomiting      (Consider location/radiation/quality/duration/timing/severity/associated sxs/prior Treatment) HPI  This is a 24 year old female with a history of multiple episodes of sinus infection. She is here with a three-week history of sinus infection. Specifically she has had nasal congestion, postnasal drip, ear pressure and low-grade fever. She was placed on unspecified anti-biotic and steroids by her PCP. She did not get relief and was switched to levofloxacin 3 days ago. She is here today complaining of dizziness, by which she means vertiginous symptoms worse with movement of the head, along with nausea and vomiting. She has vomited a total of 6 times. She denies diarrhea or abdominal pain.   Past Medical History  Diagnosis Date  . Allergy   . Asthma    Past Surgical History  Procedure Laterality Date  . Mouth surgery    . Inner ear surgery    . Knee surgery    . 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 History  Substance Use Topics  . Smoking status: Never Smoker   . Smokeless tobacco: Not on file  . Alcohol Use: Yes     Comment: Occasional   OB History    No data available     Review of Systems  All other systems reviewed and are negative.     Allergies  Review of patient's allergies indicates no known allergies.  Home Medications   Prior to Admission medications   Medication Sig Start Date End Date Taking? Authorizing Provider  ADDERALL XR 20 MG 24 hr capsule Take 20 mg by mouth daily. 04/28/15  Yes Historical Provider, MD  GILDESS FE 1/20 1-20 MG-MCG tablet Take 1 tablet by mouth daily. 04/05/15  Yes Historical Provider, MD  guaiFENesin-codeine (ROBITUSSIN AC) 100-10 MG/5ML syrup Take 5 mLs by mouth 3 (three) times daily as needed for cough.   Yes  Historical Provider, MD  levofloxacin (LEVAQUIN) 500 MG tablet Take 500 mg by mouth daily. 14 day therapy course patient began on 05/09/2015.   Yes Historical Provider, MD  predniSONE (DELTASONE) 10 MG tablet Take 10-40 mg by mouth as directed. 4-3-2-1 tablets in consecutive order for 2 day each 05/01/15   Historical Provider, MD   BP 125/85 mmHg  Pulse 106  Temp(Src) 98 F (36.7 C) (Oral)  Resp 16  Ht  (1.575 m)  Wt 95 lb (43.092 kg)  BMI 17.37 kg/m2  SpO2 99%   Physical Exam  General: Well-developed, well-nourished female in no acute distress; appearance consistent with age of record HENT: normocephalic; atraumatic; scarring of TMs bilaterally Eyes: pupils equal, round and reactive to light; extraocular muscles intact Neck: supple Heart: regular rate and rhythm; tachycardia Lungs: clear to auscultation bilaterally Abdomen: soft; nondistended; nontender; no masses or hepatosplenomegaly; bowel sounds present Extremities: No deformity; full range of motion; pulses normal Neurologic: Awake, alert and oriented; motor function intact in all extremities and symmetric; no facial droop Skin: Warm and dry Psychiatric: Normal mood and affect    ED Course  Procedures (including critical care time)   MDM   Nursing notes and vitals signs, including pulse oximetry, reviewed.  Summary of this visit's results, reviewed by myself:  Labs:  Results for orders placed or performed during the hospital encounter of 05/12/15 (  from the past 24 hour(s))  Lipase, blood     Status: Abnormal   Collection Time: 05/12/15  5:43 PM  Result Value Ref Range   Lipase 18 (L) 22 - 51 U/L  Comprehensive metabolic panel     Status: Abnormal   Collection Time: 05/12/15  5:43 PM  Result Value Ref Range   Sodium 135 135 - 145 mmol/L   Potassium 4.0 3.5 - 5.1 mmol/L   Chloride 99 (L) 101 - 111 mmol/L   CO2 28 22 - 32 mmol/L   Glucose, Bld 115 (H) 65 - 99 mg/dL   BUN 11 6 - 20 mg/dL   Creatinine, Ser  6.29 0.44 - 1.00 mg/dL   Calcium 9.1 8.9 - 52.8 mg/dL   Total Protein 7.5 6.5 - 8.1 g/dL   Albumin 4.4 3.5 - 5.0 g/dL   AST 17 15 - 41 U/L   ALT 15 14 - 54 U/L   Alkaline Phosphatase 55 38 - 126 U/L   Total Bilirubin 0.3 0.3 - 1.2 mg/dL   GFR calc non Af Amer >60 >60 mL/min   GFR calc Af Amer >60 >60 mL/min   Anion gap 8 5 - 15  CBC     Status: Abnormal   Collection Time: 05/12/15  5:43 PM  Result Value Ref Range   WBC 10.7 (H) 4.0 - 10.5 K/uL   RBC 4.35 3.87 - 5.11 MIL/uL   Hemoglobin 14.0 12.0 - 15.0 g/dL   HCT 41.3 24.4 - 01.0 %   MCV 94.5 78.0 - 100.0 fL   MCH 32.2 26.0 - 34.0 pg   MCHC 34.1 30.0 - 36.0 g/dL   RDW 27.2 53.6 - 64.4 %   Platelets 219 150 - 400 K/uL  Urinalysis, Routine w reflex microscopic (not at [facility])     Status: Abnormal   Collection Time: 05/13/15  1:04 AM  Result Value Ref Range   Color, Urine YELLOW YELLOW   APPearance TURBID (A) CLEAR   Specific Gravity, Urine 1.011 1.005 - 1.030   pH 7.5 5.0 - 8.0   Glucose, UA NEGATIVE NEGATIVE mg/dL   Hgb urine dipstick NEGATIVE NEGATIVE   Bilirubin Urine NEGATIVE NEGATIVE   Ketones, ur 15 (A) NEGATIVE mg/dL   Protein, ur NEGATIVE NEGATIVE mg/dL   Urobilinogen, UA 0.2 0.0 - 1.0 mg/dL   Nitrite NEGATIVE NEGATIVE   Leukocytes, UA MODERATE (A) NEGATIVE  Urine microscopic-add on     Status: Abnormal   Collection Time: 05/13/15  1:04 AM  Result Value Ref Range   Squamous Epithelial / LPF RARE RARE   WBC, UA 7-10 <3 WBC/hpf   Bacteria, UA MANY (A) RARE  POC urine preg, ED (not at [facility])     Status: None   Collection Time: 05/13/15  1:12 AM  Result Value Ref Range   Preg Test, Ur NEGATIVE NEGATIVE   1:55 AM Patient now drinking fluids without emesis. Her symptoms may be an adverse reaction to Levaquin and she was advised to discontinue it. We'll send her urine for culture. She was advised to contact her primary physician later this morning for further instructions.   Paula Libra, MD 05/13/15 0347  Paula Libra, MD 05/13/15 [id number]

## 2015-05-13 LAB — URINALYSIS, ROUTINE W REFLEX MICROSCOPIC
BILIRUBIN URINE: NEGATIVE
GLUCOSE, UA: NEGATIVE mg/dL
Hgb urine dipstick: NEGATIVE
Ketones, ur: 15 mg/dL — AB
Nitrite: NEGATIVE
PH: 7.5 (ref 5.0–8.0)
Protein, ur: NEGATIVE mg/dL
Specific Gravity, Urine: 1.011 (ref 1.005–1.030)
Urobilinogen, UA: 0.2 mg/dL (ref 0.0–1.0)

## 2015-05-13 LAB — URINE MICROSCOPIC-ADD ON

## 2015-05-13 LAB — POC URINE PREG, ED: Preg Test, Ur: NEGATIVE

## 2015-05-13 MED ORDER — IBUPROFEN 200 MG PO TABS
400.0000 mg | ORAL_TABLET | Freq: Once | ORAL | Status: AC
Start: 2015-05-13 — End: 2015-05-13
  Administered 2015-05-13: 400 mg via ORAL
  Filled 2015-05-13: qty 2

## 2015-05-13 MED ORDER — ONDANSETRON 4 MG PO TBDP: 4.0000 mg | ORAL_TABLET | Freq: Three times a day (TID) | ORAL | Status: AC | PRN

## 2015-05-14 LAB — URINE CULTURE: Culture: NO GROWTH

## 2015-05-28 ENCOUNTER — Other Ambulatory Visit: Payer: Self-pay | Admitting: Family Medicine

## 2015-05-28 DIAGNOSIS — J329 Chronic sinusitis, unspecified: Secondary | ICD-10-CM

## 2015-05-30 ENCOUNTER — Encounter (INDEPENDENT_AMBULATORY_CARE_PROVIDER_SITE_OTHER): Payer: Self-pay

## 2015-05-30 ENCOUNTER — Ambulatory Visit
Admission: RE | Admit: 2015-05-30 | Discharge: 2015-05-30 | Disposition: A | Discharge status: Home / Self Care | Payer: BLUE CROSS/BLUE SHIELD | Source: Ambulatory Visit | Attending: Family Medicine | Admitting: Family Medicine

## 2015-05-30 DIAGNOSIS — J329 Chronic sinusitis, unspecified: Secondary | ICD-10-CM

## 2018-03-29 ENCOUNTER — Telehealth: Payer: Self-pay

## 2018-03-29 ENCOUNTER — Encounter: Payer: Self-pay | Admitting: Neurology

## 2018-03-29 ENCOUNTER — Ambulatory Visit (INDEPENDENT_AMBULATORY_CARE_PROVIDER_SITE_OTHER): Payer: BLUE CROSS/BLUE SHIELD | Admitting: Neurology

## 2018-03-29 VITALS — BP 108/75 | HR 75 | Ht 62.0 in | Wt 107.0 lb

## 2018-03-29 DIAGNOSIS — R569 Unspecified convulsions: Secondary | ICD-10-CM

## 2018-03-29 DIAGNOSIS — G43009 Migraine without aura, not intractable, without status migrainosus: Secondary | ICD-10-CM | POA: Diagnosis not present

## 2018-03-29 DIAGNOSIS — G43109 Migraine with aura, not intractable, without status migrainosus: Secondary | ICD-10-CM | POA: Diagnosis not present

## 2018-03-29 DIAGNOSIS — H838X9 Other specified diseases of inner ear, unspecified ear: Secondary | ICD-10-CM

## 2018-03-29 DIAGNOSIS — R42 Dizziness and giddiness: Secondary | ICD-10-CM | POA: Insufficient documentation

## 2018-03-29 DIAGNOSIS — G43711 Chronic migraine without aura, intractable, with status migrainosus: Secondary | ICD-10-CM

## 2018-03-29 DIAGNOSIS — G43809 Other migraine, not intractable, without status migrainosus: Secondary | ICD-10-CM

## 2018-03-29 MED ORDER — RIZATRIPTAN BENZOATE 10 MG PO TBDP: 10.0000 mg | ORAL_TABLET | ORAL | 11 refills | Status: AC | PRN

## 2018-03-29 MED ORDER — ERENUMAB-AOOE 70 MG/ML ~~LOC~~ SOAJ: 70.0000 mg | SUBCUTANEOUS | 11 refills | Status: DC

## 2018-03-29 NOTE — Telephone Encounter (Signed)
[employer] PA done on cover my meds. Pending for decision in 3 to 5 business days.

## 2018-03-29 NOTE — Patient Instructions (Signed)
MRI brain w/wo contrast EEG Start Aimovig Acute Management: Rizatriptan  Rizatriptan disintegrating tablets What is this medicine? RIZATRIPTAN (rye za TRIP tan) is used to treat migraines with or without aura. An aura is a strange feeling or visual disturbance that warns you of an attack. It is not used to prevent migraines. This medicine may be used for other purposes; ask your health care provider or pharmacist if you have questions. COMMON BRAND NAME(S): Maxalt-MLT What should I tell my health care provider before I take this medicine? They need to know if you have any of these conditions: -bowel disease or colitis -diabetes -family history of heart disease -fast or irregular heart beat -heart or blood vessel disease, angina (chest pain), or previous heart attack -high blood pressure -high cholesterol -history of stroke, transient ischemic attacks (TIAs or mini-strokes), or intracranial bleeding -kidney or liver disease -overweight -poor circulation -postmenopausal or surgical removal of uterus and ovaries -an unusual or allergic reaction to rizatriptan, other medicines, foods, dyes, or preservatives -pregnant or trying to get pregnant -breast-feeding How should I use this medicine? Take this medicine by mouth. Follow the directions on the prescription label. This medicine is taken at the first symptoms of a migraine. It is not for everyday use. Leave the tablet in the foil package until you are ready to take it. Do not push the tablet through the blister pack. Peel open the blister pack with dry hands and place the tablet on your tongue. The tablet will dissolve rapidly and be swallowed in your saliva. It is not necessary to drink any water to take this medicine. If your migraine headache returns after one dose, you can take another dose as directed. You must leave at least 2 hours between doses, and do not take more than 30 mg total in 24 hours. If there is no improvement at all  after the first dose, do not take a second dose without talking to your doctor or health care professional. Do not take your medicine more often than directed. Talk to your pediatrician regarding the use of this medicine in children. While this drug may be prescribed for children as young as 6 years for selected conditions, precautions do apply. Overdosage: If you think you have taken too much of this medicine contact a poison control center or emergency room at once. NOTE: This medicine is only for you. Do not share this medicine with others. What if I miss a dose? This does not apply; this medicine is not for regular use. What may interact with this medicine? Do not take this medicine with any of the following medicines: -amphetamine, dextroamphetamine or cocaine -dihydroergotamine, ergotamine, ergoloid mesylates, methysergide, or ergot-type medication - do not take within 24 hours of taking rizatriptan -feverfew -MAOIs like Carbex, Eldepryl, Marplan, Nardil, and Parnate - do not take rizatriptan within 2 weeks of stopping MAOI therapy. -other migraine medicines like almotriptan, eletriptan, naratriptan, sumatriptan, zolmitriptan - do not take within 24 hours of taking rizatriptan -tryptophan This medicine may also interact with the following medications: -medicines for mental depression, anxiety or mood problems -propranolol This list may not describe all possible interactions. Give your health care provider a list of all the medicines, herbs, non-prescription drugs, or dietary supplements you use. Also tell them if you smoke, drink alcohol, or use illegal drugs. Some items may interact with your medicine. What should I watch for while using this medicine? Only take this medicine for a migraine headache. Take it if you get warning symptoms  or at the start of a migraine attack. It is not for regular use to prevent migraine attacks. You may get drowsy or dizzy. Do not drive, use machinery, or do  anything that needs mental alertness until you know how this medicine affects you. To reduce dizzy or fainting spells, do not sit or stand up quickly, especially if you are an older patient. Alcohol can increase drowsiness, dizziness and flushing. Avoid alcoholic drinks. Smoking cigarettes may increase the risk of heart-related side effects from using this medicine. If you take migraine medicines for 10 or more days a month, your migraines may get worse. Keep a diary of headache days and medicine use. Contact your healthcare professional if your migraine attacks occur more frequently. What side effects may I notice from receiving this medicine? Side effects that you should report to your doctor or health care professional as soon as possible: -allergic reactions like skin rash, itching or hives, swelling of the face, lips, or tongue -fast, slow, or irregular heart beat -increased or decreased blood pressure -seizures -severe stomach pain and cramping, bloody diarrhea -signs and symptoms of a blood clot such as breathing problems; changes in vision; chest pain; severe, sudden headache; pain, swelling, warmth in the leg; trouble speaking; sudden numbness or weakness of the face, arm or leg -tingling, pain, or numbness in the face, hands, or feet Side effects that usually do not require medical attention (report to your doctor or health care professional if they continue or are bothersome): -drowsiness -dry mouth -feeling warm, flushing, or redness of the face -headache -muscle cramps, pain -nausea, vomiting -unusually weak or tired This list may not describe all possible side effects. Call your doctor for medical advice about side effects. You may report side effects to FDA at 1-800-FDA-1088. Where should I keep my medicine? Keep out of the reach of children. Store at room temperature between 15 and 30 degrees C (59 and 86 degrees F). Protect from light and moisture. Throw away any unused  medicine after the expiration date. NOTE: This sheet is a summary. It may not cover all possible information. If you have questions about this medicine, talk to your doctor, pharmacist, or health care provider.  2018 Elsevier/Gold Standard (2013-05-22 10:17:42)

## 2018-03-29 NOTE — Progress Notes (Signed)
[facility]    Provider:  Dr Lucia GaskinsAhern Referring Provider: Mosetta PuttBlomgren, Peter, MD Primary Care Physician:  Mosetta PuttBlomgren, Peter, MD  CC:  Migraines and episodes of alteration of awareness  HPI:  Amanda Bates is a 27 y.o. female here as a referral from Dr. Duaine DredgeBlomgren for migraines, episodes of alteration of awareness, superior semicircular canal dehiscence, PPPD, vestibular migraine. The migraines can be unilateral but either side, pounding, throbbing, pulsating., light and sound sensitivity, a dark room helps, a weighted device helps, she does not always have an aura but if she does it is scintilating scotoma, she has a prodrome of cervical muscle tightness "like a brick", mother has migraines. Venlafaxine has helped significantly. 15 headache days a month, all are migrainous, they can last all day and be severe, 7 severe migraines and the rest are moderately severe, ongoing for over a year at this frequency and severity, no medication overuse. 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 Low noises and barometric pressure can cause vertigo and she can also have episodes that are sound induced and she has alteration of awareness and she feels like she is a bouncy ball, she doesn't feel like she is in her body, last episode in December.  No other focal neurologic deficits, associated symptoms, inciting events or modifiable factors.She had vestibular therapy in the past.   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  Reviewed primary care providers notes.  Patient has a history of multiple problems.  In July 2018 she developed severe vertigo with nausea and vomiting which lasted for 4 weeks.  Diazepam was not effective.   She was referred to a neuro otologist who obtained a CT scan and diagnosed superior semicircular canal dehiscence.  She was prescribed venlafaxine and is currently on 75 mg daily and this has improved her symptoms by 50% without surgical intervention.  She now primarily has vertigo when exposed to low-frequency noise such as thunderstorms.  Dr. Iona Hansenarey also felt that she had associated persistent postural perceptual dizziness related to her dehiscence.  Patient has migraines with and without aura.  When she does have an aura with scintillating scotomas but vertigo is in part of her aura as well as tightness in the lower cervical and upper thoracic spine.  Sumatriptan has not helped.  With the migraine she has nausea, photophobia, phonophobia, paresthesias of her extremities and weakness.  Venlafaxine has helped her headaches significantly.  She also has recurrent episodes of seizure-like activity started in December symptoms of altered consciousness triggered by low-frequency noise similar to migraines.  Slight difficulty with coordination will often lose motor control and have profound weakness.  Patient is conscious but unable to communicate effectively when having these.  These episodes are not necessarily associated with other migraine type symptoms of headache.  Since starting on the venlafaxine she has not had any of these episodes.  She also has depression, anxiety that has been severe at times with suicidal ideation.  She was referred to psychiatry.  For the last year she also has recurrent pain along the left upper thoracic spine and left cervical spine.  Often this pain occurs as part of a migraine aura and disappears after the headache starts.  She is been on disability since last year through the  state of [place] because of her semicircular canal dehiscence and vestibular migraines.   Review of Systems: Patient complains of symptoms per HPI as well as the following symptoms: Blurred vision, double  vision, ringing in ears, spinning sensation, trouble swallowing, memory loss, confusion, headache, numbness, weakness, slurred speech, difficulty swallowing, dizziness, insomnia, sleepiness, restless legs, depression, anxiety, decreased energy, disinterest in activities,  racing thoughts. Pertinent negatives and positives per HPI. All others negative.   Social History   Socioeconomic History  . Marital status: Single    Spouse name: Not on file  . Number of children: Not on file  . Years of education: Not on file  . Highest education level: Master's degree (e.g., MA, MS, MEng, MEd, MSW, MBA)  Occupational History  . Not on file  Social Needs  . Financial resource strain: Not on file  . Food insecurity:    Worry: Not on file    Inability: Not on file  . Transportation needs:    Medical: Not on file    Non-medical: Not on file  Tobacco Use  . Smoking status: Never Smoker  . Smokeless tobacco: Never Used  Substance and Sexual Activity  . Alcohol use: Yes    Alcohol/week: 0.6 - 1.2 oz    Types: 1 - 2 Standard drinks or equivalent per week    Comment: avg, typically drinks 1 weekend per month   . Drug use: Yes    Types: Marijuana    Comment: for nausea  . Sexual activity: Yes  Lifestyle  . Physical activity:    Days per week: Not on file    Minutes per session: Not on file  . Stress: Not on file  Relationships  . Social connections:    Talks on phone: Not on file    Gets together: Not on file    Attends religious service: Not on file    Active member of club or organization: Not on file    Attends meetings of clubs or organizations: Not on file    Relationship status: Not on file  . Intimate partner violence:    Fear of current or ex partner: Not on file    Emotionally abused: Not on file    Physically abused: Not on file    Forced sexual activity: Not on file  Other Topics Concern  . Not on file  Social History Narrative   Lives at home with fiance   Right handed    Caffeine: 1-2 cups daily    Family History  Problem Relation Age of Onset  . Migraines Neg Hx     Past Medical History:  Diagnosis Date  . Allergy   . Asthma   . Migraine     Past Surgical History:  Procedure Laterality Date  . INNER EAR SURGERY    . KNEE SURGERY    . 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 LAPAROSCOPY Left    removal of cyst from ovary   . MOUTH SURGERY      Current Outpatient Medications  Medication Sig Dispense Refill  . FEXOFENADINE HCL PO Take by mouth.    . Norethin Ace-Eth Estrad-FE (JUNEL FE 1/20 PO) Take 1 tablet by mouth daily.    Marland Kitchen venlafaxine XR (EFFEXOR-XR) 75 MG 24 hr capsule Take 75 mg by mouth daily. Take with 37.5 mg to total 112.5 mg daily    . Erenumab-aooe (AIMOVIG) 70 MG/ML SOAJ Inject 70 mg into the skin every 30 (thirty) days. 1 pen  11  . lisdexamfetamine (VYVANSE) 20 MG capsule Take by mouth.     . ondansetron (ZOFRAN ODT) 4 MG disintegrating tablet Take 1 tablet (4 mg total) by mouth every 8 (eight) hours as needed for nausea or vomiting. (Patient not taking: Reported on 03/29/2018) 10 tablet 0  . rizatriptan (MAXALT-MLT) 10 MG disintegrating tablet Take 1 tablet (10 mg total) by mouth as needed for migraine. May repeat once in 2 hours if needed 9 tablet 11   No current facility-administered medications for this visit.     Allergies as of 03/29/2018  . (No Known Allergies)    Vitals: BP 108/75 (BP Location: Right Arm, Patient Position: Sitting)   Pulse 75   Ht 5\' 2"  (1.575 m)   Wt 107 lb (48.5 kg)   BMI 19.57 kg/m  Last Weight:  Wt Readings from Last 1 Encounters:  03/29/18 107 lb (48.5 kg)   Last Height:   Ht Readings from Last 1 Encounters:  03/29/18 5\' 2"  (1.575 m)    Physical exam: Exam: Gen: NAD, conversant, well nourised, well groomed                     CV: RRR, no MRG. No Carotid Bruits. No peripheral edema, warm, nontender Eyes: Conjunctivae clear without exudates or hemorrhage  Neuro: Detailed  Neurologic Exam  Speech:    Speech is normal; fluent and spontaneous with normal comprehension.  Cognition:    The patient is oriented to person, place, and time;     recent and remote memory intact;     language fluent;     normal attention, concentration,     fund of knowledge Cranial Nerves:    The pupils are equal, round, and reactive to light. The fundi are normal and spontaneous venous pulsations are present. Visual fields are full to finger confrontation. Extraocular movements are intact. Trigeminal sensation is intact and the muscles of mastication are normal. The face is symmetric. The palate elevates in the midline. Hearing intact. Voice is normal. Shoulder shrug is normal. The tongue has normal motion without fasciculations.   Coordination:    Normal finger to nose and heel to shin. Normal rapid alternating movements.   Gait:    Heel-toe and tandem gait are normal.   Motor Observation:    No asymmetry, no atrophy, and no involuntary movements noted. Tone:    Normal muscle tone.    Posture:    Posture is normal. normal erect    Strength: Minmal L>R hip flexion weakness otherwise strength is V/V in the upper and lower limbs.      Sensation: intact to LT     Reflex Exam:  DTR's:    Deep tendon reflexes in the upper and lower extremities are normal bilaterally.   Toes:    The toes are downgoing bilaterally.   Clonus:    Clonus is absent.      Assessment/Plan:   27 y.o. female here as a referral from Dr. Duaine Dredge for migraines, episodes of alteration of awareness, superior semicircular canal dehiscence(SSCD), PPPD, vestibular migraine.   Migraines: Try a different triptan Maxalt at onset of headache.She is going to increase to Venlafaxine 150mg , would not start another oral medication until we see results Also discussed the new CGRP medications, discussed. She prefers to try the new cgrp meds, discussed unclear about future of insurance qualifications but now has  a copay program for a year. Start Aimovig 70mg  and then increase to 140. Discussed side effects,  teratogenicity, do not get pregnant. Must be off medication for 5 months before conception.  Episodes of alteration of awareness very unlikely seizures but needs thorough workup including MRI of the brain w/wo contrast, EEG    Orders Placed This Encounter  Procedures  . MR BRAIN W WO CONTRAST  . EEG    Meds ordered this encounter  Medications  . rizatriptan (MAXALT-MLT) 10 MG disintegrating tablet    Sig: Take 1 tablet (10 mg total) by mouth as needed for migraine. May repeat once in 2 hours if needed    Dispense:  9 tablet    Refill:  11  . Erenumab-aooe (AIMOVIG) 70 MG/ML SOAJ    Sig: Inject 70 mg into the skin every 30 (thirty) days.    Dispense:  1 pen    Refill:  11    Patient has copay card     Discussed: To prevent or relieve headaches, try the following: Cool Compress. Lie down and place a cool compress on your head.  Avoid headache triggers. If certain foods or odors seem to have triggered your migraines in the past, avoid them. A headache diary might help you identify triggers.  Include physical activity in your daily routine. Try a daily walk or other moderate aerobic exercise.  Manage stress. Find healthy ways to cope with the stressors, such as delegating tasks on your to-do list.  Practice relaxation techniques. Try deep breathing, yoga, massage and visualization.  Eat regularly. Eating regularly scheduled meals and maintaining a healthy diet might help prevent headaches. Also, drink plenty of fluids.  Follow a regular sleep schedule. Sleep deprivation might contribute to headaches Consider biofeedback. With this mind-body technique, you learn to control certain bodily functions - such as muscle tension, heart rate and blood pressure - to prevent headaches or reduce headache pain.    Proceed to emergency room if you experience new or worsening symptoms or symptoms do  not resolve, if you have new neurologic symptoms or if headache is severe, or for any concerning symptom.   Provided education and documentation from American headache Society toolbox including articles on: chronic migraine medication overuse headache, chronic migraines, prevention of migraines, behavioral and other nonpharmacologic treatments for headache.  Cc: Dr. Lavonia Dana, MD  [facility] [address] [place], [place] [address]  Phone [phone] Fax [phone]

## 2018-03-30 ENCOUNTER — Ambulatory Visit
Admission: RE | Admit: 2018-03-30 | Discharge: 2018-03-30 | Disposition: A | Discharge status: Home / Self Care | Payer: BLUE CROSS/BLUE SHIELD | Source: Ambulatory Visit | Attending: Family Medicine | Admitting: Family Medicine

## 2018-03-30 ENCOUNTER — Telehealth: Payer: Self-pay | Admitting: Neurology

## 2018-03-30 ENCOUNTER — Other Ambulatory Visit: Payer: Self-pay | Admitting: Family Medicine

## 2018-03-30 DIAGNOSIS — M546 Pain in thoracic spine: Secondary | ICD-10-CM

## 2018-03-30 DIAGNOSIS — M542 Cervicalgia: Secondary | ICD-10-CM

## 2018-03-30 NOTE — Telephone Encounter (Signed)
lvm on her mom's # bc the # she has on her chart is the wrong #  Amanda GroomsBCBS [place] auth: NPR Ref # [id number]

## 2018-03-30 NOTE — Telephone Encounter (Signed)
Pt returned Emily's call. Phone number verified to be correct in the system. Please call to advise

## 2018-04-03 NOTE — Telephone Encounter (Signed)
I [facility] the patient [facility]

## 2018-04-03 NOTE — Telephone Encounter (Signed)
Patient is returning your call.  

## 2018-04-03 NOTE — Telephone Encounter (Signed)
Aimovig denied by insurance. The reason is patient needs to try and failed two drugs to lower the number of migraine headaches per month. These drugs are amitriptyline, venlafaxine, alenolol, metoprolol, nadolol, propranolol, timolol, divalproex, valproic acid, and topirmate. It states if patient try at least two drugs and they do not work to lower the number of migraines, or provider can send a letter statement why pt cannot take these drugs, then they may cover Aimovig.

## 2018-04-03 NOTE — Telephone Encounter (Signed)
LEft vm for patient to call back to see if she has the access card for aimovig. Her insurance denied the aimovig.

## 2018-04-03 NOTE — Telephone Encounter (Signed)
Have her use the copay card thanks

## 2018-04-03 NOTE — Telephone Encounter (Signed)
Left a voicemail for the patient to call me back about scheduling mri.  °

## 2018-04-05 ENCOUNTER — Ambulatory Visit: Payer: BLUE CROSS/BLUE SHIELD

## 2018-04-05 DIAGNOSIS — R569 Unspecified convulsions: Secondary | ICD-10-CM | POA: Diagnosis not present

## 2018-04-05 MED ORDER — GADOPENTETATE DIMEGLUMINE 469.01 MG/ML IV SOLN
10.0000 mL | Freq: Once | INTRAVENOUS | Status: AC | PRN
  Administered 2018-04-05: 10 mL via INTRAVENOUS

## 2018-04-05 NOTE — Telephone Encounter (Signed)
Rn call patient about her insurance denying the aimovig prescription. Rn ask does she have the access card to get a discount or free refills for a year. PT stated she does have the access card, but its not activated. PT stated she may not take the  aimovig because she and her partner are considering pregnancy in the near future. Dr.Ahern told her she will have to be off the medication if she is trying to get pregnant. So pt stated she will not take the medication as of now. Rn stated a message will be sent to Dr. Lucia GaskinsAhern.

## 2018-04-10 ENCOUNTER — Telehealth: Payer: Self-pay | Admitting: *Deleted

## 2018-04-10 NOTE — Telephone Encounter (Signed)
LVM informing patient her MRI brain scan is normal. Left office number and advised she call back tomorrow if she has any questions.

## 2018-04-11 NOTE — Telephone Encounter (Signed)
Brandi? With covermymeds requesting a call back stating she is wanting to know if the appeal for aimovig had been received and if the pt still wished to move forward. Please call to advise at [phone]

## 2018-04-11 NOTE — Telephone Encounter (Signed)
Returned call to Cover My Meds. Informed them that the patient has decided not to start Aimovig at this time; therefore appeal will not be pursued. Representative verbalized understanding and appreciation and will be there for support if appeal ends up being needed.

## 2018-04-12 ENCOUNTER — Ambulatory Visit: Payer: BLUE CROSS/BLUE SHIELD | Admitting: Diagnostic Neuroimaging

## 2018-04-12 DIAGNOSIS — R569 Unspecified convulsions: Secondary | ICD-10-CM

## 2018-04-14 NOTE — Procedures (Signed)
   [facility]  EEG (ELECTROENCEPHALOGRAM) REPORT   STUDY DATE: 04/12/18 PATIENT NAME: Amanda ScotJulia K Angerer DOB: 10-Dec-1990 MRN: [id number]  ORDERING CLINICIAN: Naomie DeanAntonia Ahern, MD   TECHNOLOGIST: Charlett BlakeN Willard TECHNIQUE: Electroencephalogram was recorded utilizing standard 10-20 system of lead placement and reformatted into average and bipolar montages.  RECORDING TIME: 23 minutes  ACTIVATION: hyperventilation and photic stimulation  CLINICAL INFORMATION: 27 year old female with abnormal spells.  FINDINGS: Posterior dominant background rhythms, which attenuate with eye opening, ranging 12-13 hertz, occasionally 15-16 hertz, and 30-35 microvolts. No focal, lateralizing, epileptiform activity or seizures are seen. Patient recorded in the awake and drowsy state. EKG channel shows regular rhythm of 75-80 beats per minute.   IMPRESSION:   Normal EEG in the awake and drowsy states.    INTERPRETING PHYSICIAN:  Suanne MarkerVIKRAM R. Angela Vazguez, MD Certified in Neurology, Neurophysiology and Neuroimaging  [facility] [address], [address] [place], [place] [address] [phone]

## 2018-04-17 ENCOUNTER — Telehealth: Payer: Self-pay | Admitting: *Deleted

## 2018-04-17 NOTE — Telephone Encounter (Signed)
-----   Message from Anson FretAntonia B Ahern, MD sent at 04/14/2018  9:08 PM EDT ----- EEG normal

## 2018-04-17 NOTE — Telephone Encounter (Signed)
Called pt & LVM (ok per DPR) informing pt that her EEG was normal. Left office number if pt has any questions.

## 2018-07-07 ENCOUNTER — Ambulatory Visit: Payer: BLUE CROSS/BLUE SHIELD | Admitting: Psychiatry

## 2018-09-05 IMAGING — CR DG CERVICAL SPINE COMPLETE 4+V
5 series · 5 of 5 positions shown · non-contrast
Comparison: None.

CLINICAL DATA: Migraine headaches and neck stiffness.

EXAM:
CERVICAL SPINE - COMPLETE 4+ VIEW

[w cervical spine lat]
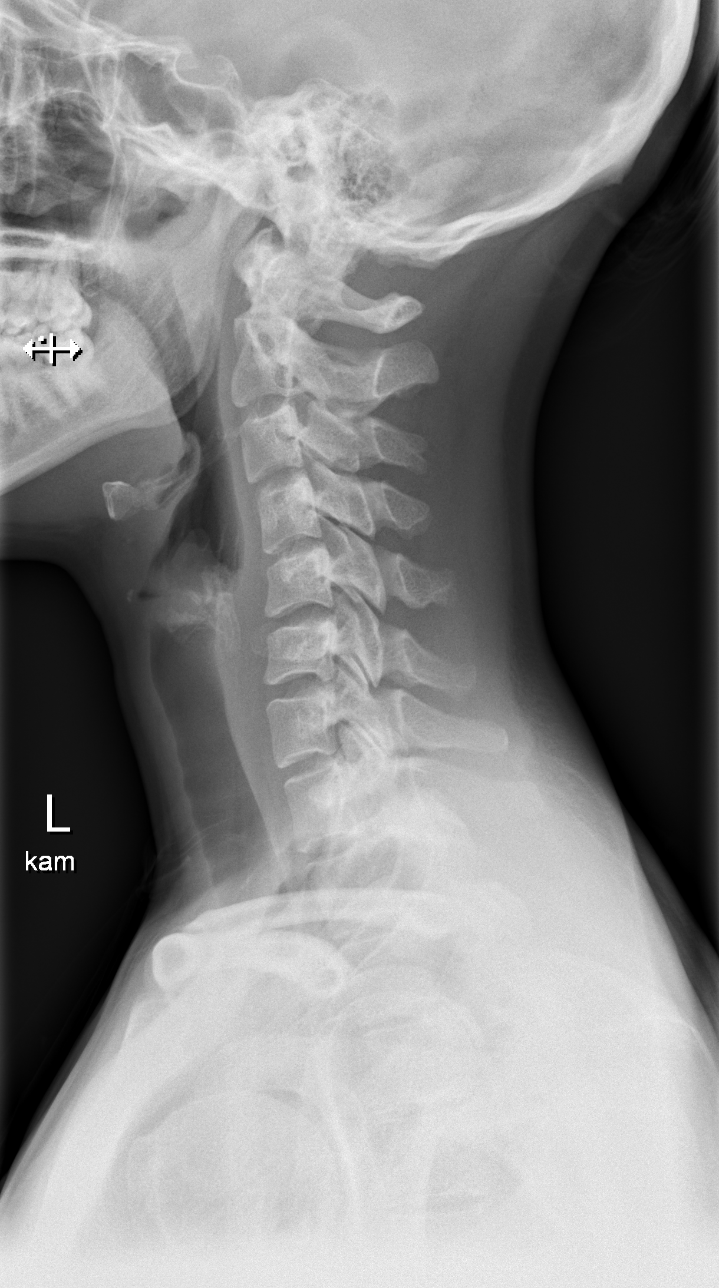

[w cervical spine ap_obl (1 of 2)]
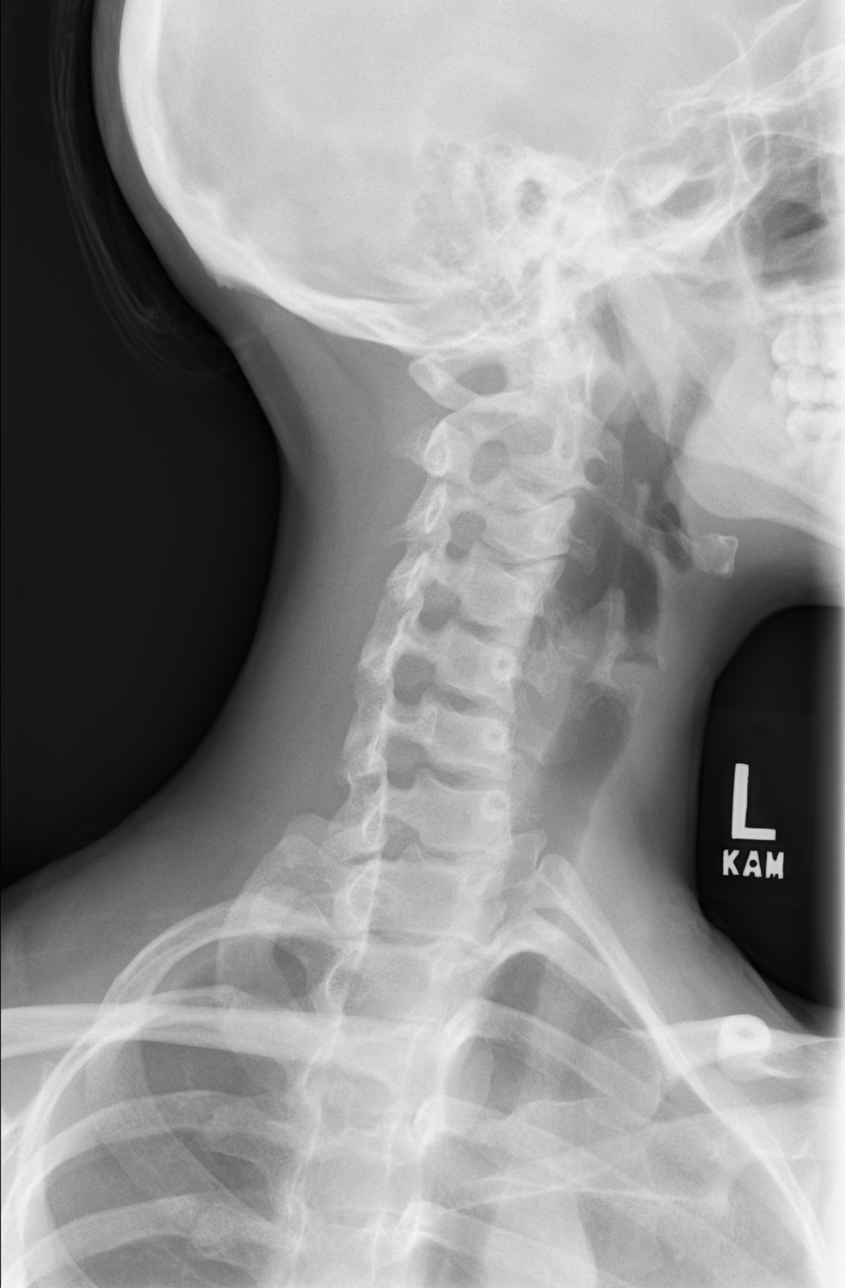

[w cervical spine ap_obl (2 of 2)]
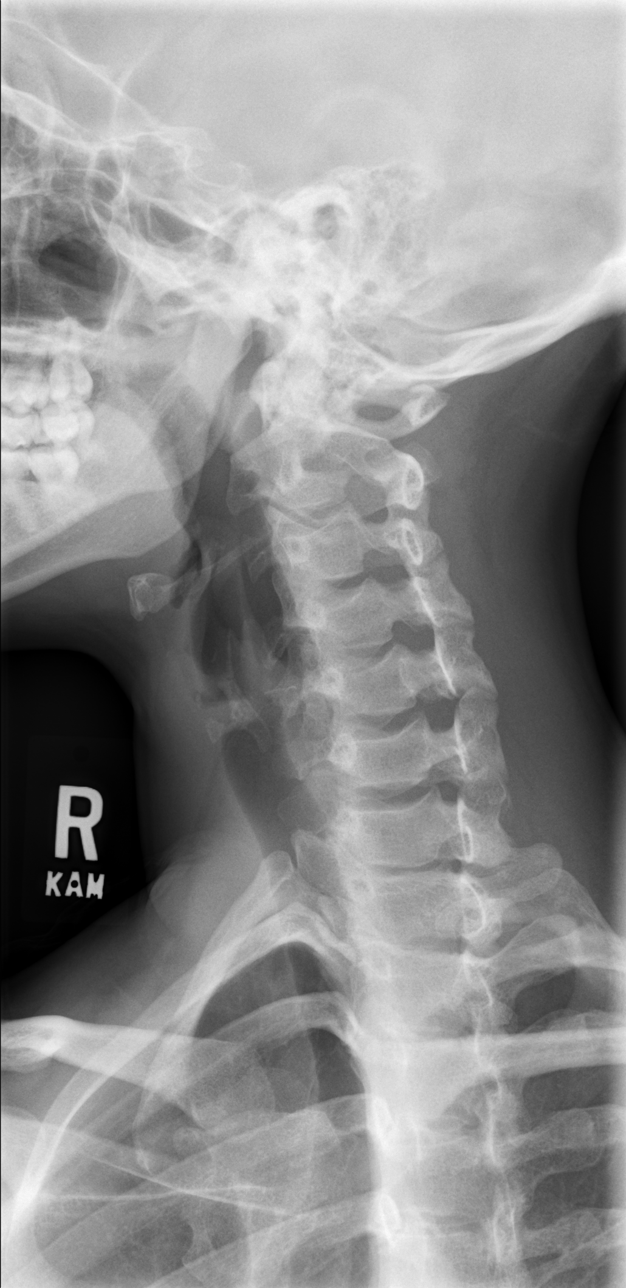

[w cervical spine ap]
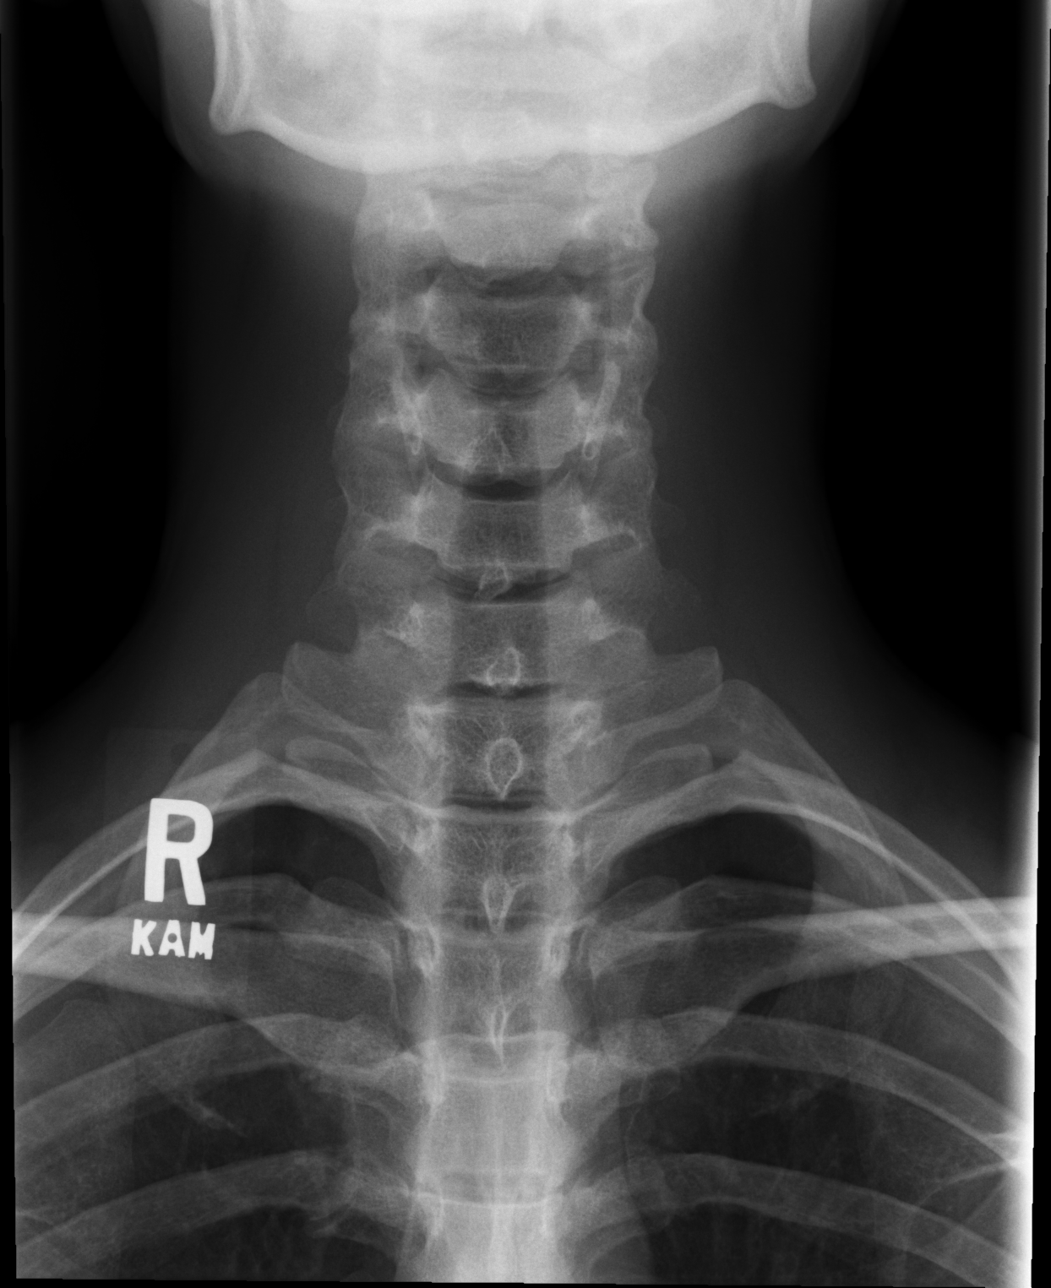

[w cervical spine odontoid]
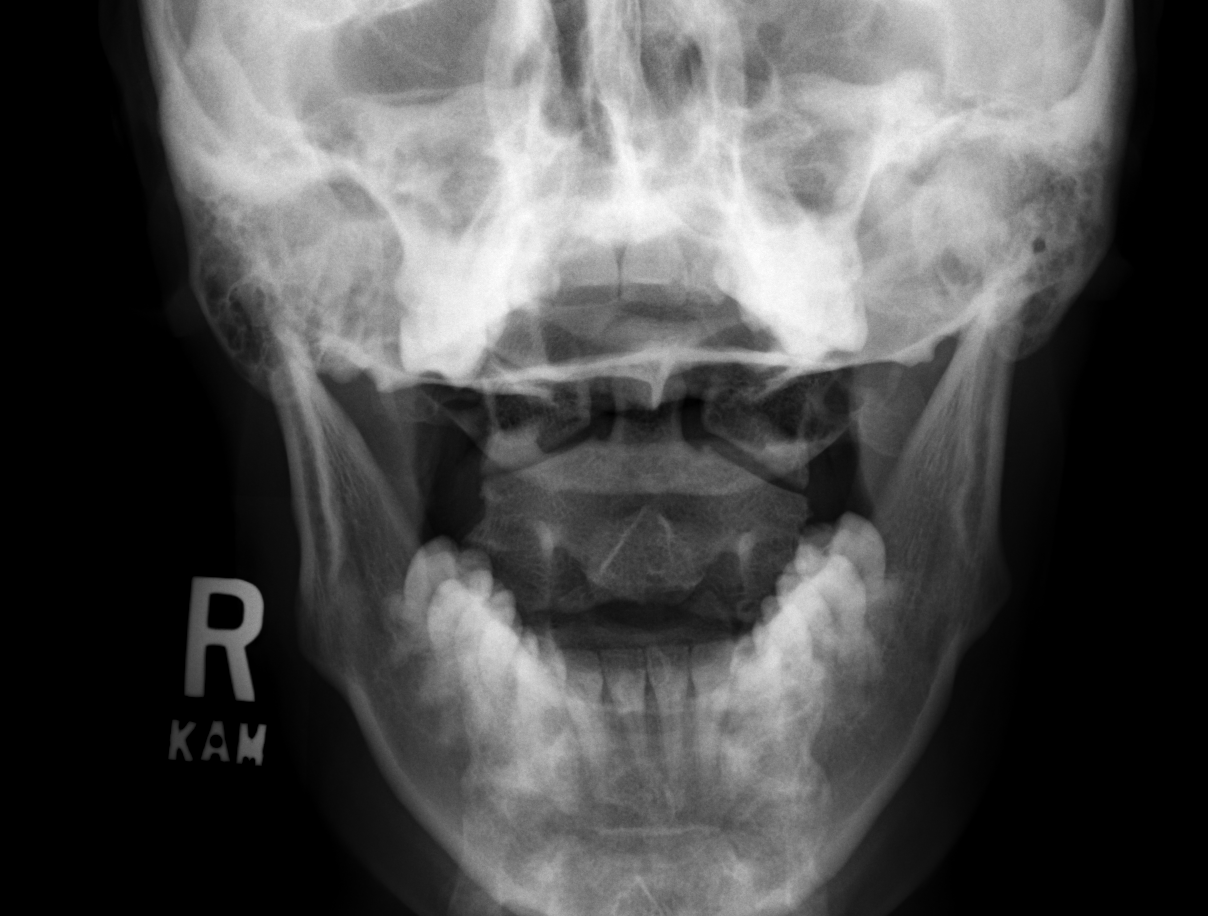

[5 of 5 positions shown; findings below may reference images not displayed]

FINDINGS: There is no evidence of cervical spine fracture or prevertebral soft
tissue swelling. Slight reversal cervical lordosis with apex at C5.
No significant disc flattening. No jumped or perched facets. Patent
neural foramina bilaterally without significant encroachment.. No
other significant bone abnormalities are identified.
IMPRESSION: Slight reversal cervical lordosis may be due to muscle spasm or
patient positioning. No acute osseous abnormality.

## 2018-10-03 ENCOUNTER — Encounter

## 2018-10-24 ENCOUNTER — Ambulatory Visit (INDEPENDENT_AMBULATORY_CARE_PROVIDER_SITE_OTHER): Payer: BLUE CROSS/BLUE SHIELD | Admitting: Adult Health

## 2018-10-24 ENCOUNTER — Encounter: Payer: Self-pay | Admitting: Adult Health

## 2018-10-24 VITALS — BP 109/63 | HR 75 | Ht 62.0 in | Wt 111.8 lb

## 2018-10-24 DIAGNOSIS — G43809 Other migraine, not intractable, without status migrainosus: Secondary | ICD-10-CM

## 2018-10-24 DIAGNOSIS — G43009 Migraine without aura, not intractable, without status migrainosus: Secondary | ICD-10-CM

## 2018-10-24 DIAGNOSIS — G43109 Migraine with aura, not intractable, without status migrainosus: Secondary | ICD-10-CM

## 2018-10-24 NOTE — Patient Instructions (Signed)
Your Plan:  Continue to monitor symptoms If your symptoms worsen or you develop new symptoms please let us know.   Thank you for coming to see us at [facility]. I hope we have been able to provide you high quality care today.  You may receive a patient satisfaction survey over the next few weeks. We would appreciate your feedback and comments so that we may continue to improve ourselves and the health of our patients.   

## 2018-10-24 NOTE — Progress Notes (Signed)
Agree with history, physical, neuro exam,assessment and plan as stated.     Otila Starn, MD [facility] 

## 2018-10-24 NOTE — Progress Notes (Signed)
PATIENT: Amanda Bates DOB: [birth year]  REASON FOR VISIT: follow up HISTORY FROM: patient  HISTORY OF PRESENT ILLNESS: Today 10/24/18:  Amanda Bates is a 28 year old female with a history of migraine headaches and vestibular migraines..  She returns today for follow-up.  She states that she is doing well.  She states that she currently feels the best that she has felt.  She is not having very many vestibular symptoms denies vertigo.  She states Effexor has been beneficial for her headaches.  Her OB/GYN is trying to taper off her birth control.  She states that she is not had to use Maxalt.  She returns today for evaluation.  HISTORY [name] is a 28 y.o. female here as a referral from Dr. Duaine Dredge for migraines, episodes of alteration of awareness, superior semicircular Bates dehiscence, PPPD, vestibular migraine. The migraines can be unilateral but either side, pounding, throbbing, pulsating., light and sound sensitivity, a dark room helps, a weighted device helps, she does not always have an aura but if she does it is scintilating scotoma, she has a prodrome of cervical muscle tightness "like a brick", mother has migraines. Venlafaxine has helped significantly. 15 headache days a month, all are migrainous, they can last all day and be severe, 7 severe migraines and the rest are moderately severe, ongoing for over a year at this frequency and severity, no medication overuse. 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 Low noises and barometric pressure can cause vertigo and she can also have episodes that are sound induced and she has alteration of awareness and she feels like she is a bouncy ball, she doesn't feel like she is in her body, last episode in December.  No other focal neurologic deficits, associated symptoms, inciting events or modifiable factors.She had vestibular therapy in the past.   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  Reviewed primary care providers notes.  Patient has a history of multiple problems.  In July 2018 she developed severe vertigo with nausea and vomiting which lasted for 4 weeks.  Diazepam was not effective.  She was referred to a neuro otologist who obtained a CT scan and diagnosed superior semicircular Bates dehiscence.  She was prescribed venlafaxine and is currently on 75 mg daily and this has improved her symptoms by 50% without surgical intervention.  She now primarily has vertigo when exposed to low-frequency noise such as thunderstorms.  Dr. Iona Hansen also felt that she had associated persistent postural perceptual dizziness related to her dehiscence.  Patient has migraines with and without aura.  When she does have an aura with scintillating scotomas but vertigo is in part of her aura as well as tightness in the lower cervical and upper thoracic spine.  Sumatriptan has not helped.  With the migraine she has nausea, photophobia, phonophobia, paresthesias of her extremities and weakness.  Venlafaxine has helped her headaches significantly.  She also has recurrent episodes of seizure-like activity started in December symptoms of altered consciousness triggered by low-frequency noise similar to migraines.  Slight difficulty with coordination will often lose motor control and have profound weakness.  Patient is conscious but unable to communicate effectively when having these.  These episodes are not necessarily associated with other migraine type symptoms of headache.  Since starting on the venlafaxine she has not had any of these episodes.  She also has depression, anxiety that has been severe at times with suicidal ideation.  She was referred to psychiatry.  For  the last year she also has recurrent pain along the left upper thoracic spine and left cervical spine.  Often this pain occurs as part of a migraine aura and disappears after the headache starts.  She is been on disability since last year through the state of [place] because of her semicircular Bates dehiscence and vestibular migraines.    REVIEW OF SYSTEMS: Out of a complete 14 system review of symptoms, the patient complains only of the following symptoms, and all other reviewed systems are negative.  See HPI  ALLERGIES: No Known Allergies  HOME MEDICATIONS: Outpatient Medications Prior to Visit  Medication Sig Dispense Refill  . FEXOFENADINE HCL PO Take by mouth.    . Norethin Ace-Eth Estrad-FE (JUNEL FE 1/20 PO) Take 1 tablet by mouth daily.    . ondansetron (ZOFRAN ODT) 4 MG disintegrating tablet Take 1 tablet (4 mg total) by mouth every 8 (eight) hours as needed for nausea or vomiting. 10 tablet 0  . rizatriptan (MAXALT-MLT) 10 MG disintegrating tablet Take 1 tablet (10 mg total) by mouth as needed for migraine. May repeat once in 2 hours if needed 9 tablet 11  . venlafaxine XR (EFFEXOR-XR) 150 MG 24 hr capsule 150 mg daily.    Marland Kitchen. VYVANSE 30 MG capsule 30 mg daily.    Dorise Hiss. Erenumab-aooe (AIMOVIG) 70 MG/ML SOAJ Inject 70 mg into the skin every 30 (thirty) days. (Patient not taking: Reported on 10/24/2018) 1 pen 11  . lisdexamfetamine (VYVANSE) 20 MG capsule Take by mouth.     . venlafaxine XR (EFFEXOR-XR) 75 MG 24 hr capsule Take 75 mg by mouth daily. Take with 37.5 mg to total 112.5 mg daily     No facility-administered medications prior to visit.     PAST MEDICAL HISTORY: Past Medical History:  Diagnosis Date  . Allergy   . Asthma   . Migraine     PAST SURGICAL HISTORY: Past Surgical History:  Procedure Laterality Date  . INNER EAR SURGERY    . KNEE SURGERY    . 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 LAPAROSCOPY Left    removal of cyst from ovary   . MOUTH SURGERY       FAMILY HISTORY: Family History  Problem Relation Age of Onset  . Migraines Neg Hx     SOCIAL HISTORY: Social History   Socioeconomic History  . Marital status: Single    Spouse name: Not on file  . Number of children: Not on file  . Years of education: Not on file  . Highest education level: Master's degree (e.g., MA, MS, MEng, MEd, MSW, MBA)  Occupational History  . Not on file  Social Needs  . Financial resource strain: Not on file  . Food insecurity:    Worry: Not on file    Inability: Not on file  . Transportation needs:    Medical: Not on file    Non-medical: Not on file  Tobacco Use  . Smoking status: Never Smoker  . Smokeless tobacco: Never Used  Substance and Sexual Activity  . Alcohol use: Yes    Alcohol/week: 1.0 - 2.0 standard drinks    Types: 1 - 2 Standard drinks or equivalent per week    Comment: avg, typically drinks 1 weekend per month   .  Drug use: Yes    Types: Marijuana    Comment: for nausea  . Sexual activity: Yes  Lifestyle  . Physical activity:    Days per week: Not on file    Minutes per session: Not on file  . Stress: Not on file  Relationships  . Social connections:    Talks on phone: Not on file    Gets together: Not on file    Attends religious service: Not on file    Active member of club or organization: Not on file    Attends meetings of clubs or organizations: Not on file    Relationship status: Not on file  . Intimate partner violence:    Fear of current or ex partner: Not on file    Emotionally abused: Not on file    Physically abused: Not on file    Forced sexual activity: Not on file  Other Topics Concern  . Not on file  Social History Narrative   Lives at home with fiance   Right handed   Caffeine: 1-2 cups daily      PHYSICAL EXAM  Vitals:   10/24/18 1459  BP: 109/63  Pulse: 75  Weight: 111 lb 12.8 oz (50.7 kg)  Height: 5\' 2"  (1.575 m)   Body mass index is 20.45 kg/m.  Generalized: Well developed,  in no acute distress   Neurological examination  Mentation: Alert oriented to time, place, history taking. Follows all commands speech and language fluent Cranial nerve II-XII: Pupils were equal round reactive to light. Extraocular movements were full, visual field were full on confrontational test. Facial sensation and strength were normal. Uvula tongue midline. Head turning and shoulder shrug  were normal and symmetric. Motor: The motor testing reveals 5 over 5 strength of all 4 extremities. Good symmetric motor tone is noted throughout.  Sensory: Sensory testing is intact to soft touch on all 4 extremities. No evidence of extinction is noted.  Coordination: Cerebellar testing reveals good finger-nose-finger and heel-to-shin bilaterally.  Gait and station: Gait is normal.    DIAGNOSTIC DATA (LABS, IMAGING, TESTING) - I reviewed patient records, labs, notes, testing and imaging myself where available.  Lab Results  Component Value Date   WBC 10.7 (H) 05/12/2015   HGB 14.0 05/12/2015   HCT 41.1 05/12/2015   MCV 94.5 05/12/2015   PLT 219 05/12/2015      Component Value Date/Time   NA 135 05/12/2015 1743   K 4.0 05/12/2015 1743   CL 99 (L) 05/12/2015 1743   CO2 28 05/12/2015 1743   GLUCOSE 115 (H) 05/12/2015 1743   BUN 11 05/12/2015 1743   CREATININE 0.80 05/12/2015 1743   CREATININE 0.72 09/02/2013 1229   CALCIUM 9.1 05/12/2015 1743   PROT 7.5 05/12/2015 1743   ALBUMIN 4.4 05/12/2015 1743   AST 17 05/12/2015 1743   ALT 15 05/12/2015 1743   ALKPHOS 55 05/12/2015 1743   BILITOT 0.3 05/12/2015 1743   GFRNONAA >60 05/12/2015 1743   GFRAA >60 05/12/2015 1743     ASSESSMENT AND PLAN 28 y.o. year old female  has a past medical history of Allergy, Asthma, and Migraine. here with:  1.  Migraine headache 2.  Vestibular migraine  Overall the patient has remained relatively stable.  She reports that she is getting good benefit from Effexor which is prescribed by her primary care  provider.  She is currently working with her OB/GYN to taper off of her birth control.  I have advised that if  her symptoms worsen or she develops new symptoms she should let us know.  Otherwise she will follow-up in 1 year or sooner if needed.   I spent 15 minutes with the patient. 50% of this time was spent discussing plan of care   Butch PennyMegan Raizel Wesolowski, MSN, NP-C 10/24/2018, 3:05 PM [facility] [address], [address] [place], [place] [address] [phone]

## 2019-10-30 ENCOUNTER — Ambulatory Visit: Payer: Self-pay | Admitting: Adult Health

## 2019-10-30 ENCOUNTER — Encounter: Payer: Self-pay | Admitting: Adult Health
